# Patient Record
Sex: Male | Born: 1995 | Race: Black or African American | Hispanic: No | Marital: Single | Smoking: Former smoker
Health system: Southern US, Community
[De-identification: ages and names within clinical notes are randomized; demographics above are authoritative.]

## PROBLEM LIST (undated history)

## (undated) DIAGNOSIS — F329 Major depressive disorder, single episode, unspecified: Secondary | ICD-10-CM

## (undated) DIAGNOSIS — F419 Anxiety disorder, unspecified: Secondary | ICD-10-CM

## (undated) DIAGNOSIS — F909 Attention-deficit hyperactivity disorder, unspecified type: Secondary | ICD-10-CM

## (undated) DIAGNOSIS — J45909 Unspecified asthma, uncomplicated: Secondary | ICD-10-CM

## (undated) DIAGNOSIS — F32A Depression, unspecified: Secondary | ICD-10-CM

## (undated) DIAGNOSIS — F319 Bipolar disorder, unspecified: Secondary | ICD-10-CM

## (undated) DIAGNOSIS — G43909 Migraine, unspecified, not intractable, without status migrainosus: Secondary | ICD-10-CM

## (undated) HISTORY — DX: Anxiety disorder, unspecified: F41.9

## (undated) HISTORY — DX: Major depressive disorder, single episode, unspecified: F32.9

## (undated) HISTORY — DX: Migraine, unspecified, not intractable, without status migrainosus: G43.909

## (undated) HISTORY — PX: HERNIA REPAIR: SHX51

## (undated) HISTORY — DX: Depression, unspecified: F32.A

---
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
## 2014-02-07 ENCOUNTER — Emergency Department (HOSPITAL_COMMUNITY)
Admission: EM | Admit: 2014-02-07 | Discharge: 2014-02-07 | Disposition: A | Discharge status: Home / Self Care | Payer: Federal, State, Local not specified - PPO | Attending: Emergency Medicine | Admitting: Emergency Medicine

## 2014-02-07 ENCOUNTER — Encounter (HOSPITAL_COMMUNITY): Payer: Self-pay | Admitting: Emergency Medicine

## 2014-02-07 DIAGNOSIS — IMO0002 Reserved for concepts with insufficient information to code with codable children: Secondary | ICD-10-CM | POA: Insufficient documentation

## 2014-02-07 DIAGNOSIS — Z8659 Personal history of other mental and behavioral disorders: Secondary | ICD-10-CM | POA: Insufficient documentation

## 2014-02-07 DIAGNOSIS — J45909 Unspecified asthma, uncomplicated: Secondary | ICD-10-CM | POA: Insufficient documentation

## 2014-02-07 DIAGNOSIS — A6000 Herpesviral infection of urogenital system, unspecified: Secondary | ICD-10-CM

## 2014-02-07 DIAGNOSIS — F17200 Nicotine dependence, unspecified, uncomplicated: Secondary | ICD-10-CM | POA: Insufficient documentation

## 2014-02-07 MED ORDER — HYDROCODONE-ACETAMINOPHEN 5-325 MG PO TABS: 1.0000 | ORAL_TABLET | ORAL | Status: DC | PRN

## 2014-02-07 MED ORDER — ACYCLOVIR 400 MG PO TABS: 400.0000 mg | ORAL_TABLET | Freq: Three times a day (TID) | ORAL | Status: DC

## 2014-02-07 NOTE — ED Provider Notes (Signed)
CSN: [id number]     Arrival date & time 02/07/14  0934 History   This chart was scribed for non-physician practitioner, Mellody DrownLauren Emmi Wertheim, working with Gavin PoundMichael Y. Oletta LamasGhim, MD, by Tana ConchStephen Methvin ED Scribe. This patient was seen in TR05C/TR05C and the patient's care was started at 10:23 AM.    Chief Complaint  Patient presents with  . Rash     The history is provided by the patient. No language interpreter was used.    HPI Comments: Brian PhenesKentariq Foard-Davis is a 18 y.o. male who presents to the Emergency Department complaining of a rash on his penis that has been present for 3 days, he describes them as "puss bumps" and that they are painful to the touch.  THe patient sates they were yellow-clear fluid filled bumps, now a scab.He reports associated itching. He denies penile discharge, testicular pain, and scrotal swelling. Pt recently stopped using condoms with his girlfriend. Pt reports that he has not had similiar symptoms before. Denies history of STI.   Past Medical History  Diagnosis Date  . Depression   . Bipolar 1 disorder   . ADHD (attention deficit hyperactivity disorder)   . Anxiety   . Asthma    Past Surgical History  Procedure Laterality Date  . Hernia repair     No family history on file. History  Substance Use Topics  . Smoking status: Current Every Day Smoker  . Smokeless tobacco: Not on file  . Alcohol Use: Yes    Review of Systems  Constitutional: Negative for fever and chills.  Gastrointestinal: Negative for abdominal pain.  Genitourinary: Positive for genital sores. Negative for dysuria, discharge, penile swelling, scrotal swelling, difficulty urinating, penile pain and testicular pain.  All other systems reviewed and are negative.     Allergies  Review of patient's allergies indicates no known allergies.  Home Medications   Prior to Admission medications   Medication Sig Start Date End Date Taking? Authorizing Provider  triamcinolone (NASACORT AQ) 55 MCG/ACT  AERO nasal inhaler Place 2 sprays into the nose daily. 11/11/13   Shari A Upstill, PA-C   BP 149/89  Pulse 86  Temp(Src) 97.5 F (36.4 C) (Oral)  Resp 18  SpO2 95% Physical Exam  Nursing note and vitals reviewed. Constitutional: He is oriented to person, place, and time. He appears well-developed and well-nourished. No distress.  HENT:  Head: Normocephalic and atraumatic.  Eyes: Pupils are equal, round, and reactive to light. No scleral icterus.  Neck: Normal range of motion.  Pulmonary/Chest: Effort normal. No respiratory distress.  Abdominal: Soft.  Genitourinary: Testes normal. Right testis shows no swelling and no tenderness. Left testis shows no swelling and no tenderness. Circumcised. No discharge found.  2 painful ulcertive lesions to the right shaft of the penis. Chaperone present.    Musculoskeletal: Normal range of motion.  Lymphadenopathy:       Right: No inguinal adenopathy present.       Left: No inguinal adenopathy present.  Neurological: He is alert and oriented to person, place, and time.  Skin: Skin is warm and dry. Rash noted. He is not diaphoretic.  Psychiatric: He has a normal mood and affect. His behavior is normal.    ED Course  Procedures (including critical care time)  10:26 AM-Discussed treatment plan which includes antibiotics and a f/u with PCP with pt at bedside and pt agreed to plan.   Labs Review Labs Reviewed  GC/CHLAMYDIA PROBE AMP  RPR  HIV ANTIBODY (ROUTINE TESTING)  Imaging Review No results found.   EKG Interpretation None      MDM   Final diagnoses:  Herpes genitalis   Pt with painful ulcerative lesions to penile shaft.  STD panel ordered. Will treat for Genital Herpes, encouraged pt to discuss with current partner for treatment. And encouraged Condoms with every sexual encounter. Discussed lab results, imaging results, and treatment plan with the patient. Return precautions given. Reports understanding and no other concerns  at this time.  Patient is stable for discharge at this time.  Meds given in ED:  Medications - No data to display  New Prescriptions   ACYCLOVIR (ZOVIRAX) 400 MG TABLET    Take 1 tablet (400 mg total) by mouth 3 (three) times daily.   HYDROCODONE-ACETAMINOPHEN (NORCO/VICODIN) 5-325 MG PER TABLET    Take 1 tablet by mouth every 4 (four) hours as needed.    I personally performed the services described in this documentation, which was scribed in my presence. The recorded information has been reviewed and is accurate.    Clabe SealLauren M Santanna Olenik, PA-C 02/09/14 1314

## 2014-02-07 NOTE — ED Notes (Signed)
Pt reporting rash to penis for 3 days. Is itchy. No penile discharge. States new girlfriend and wants to make sure no STD.

## 2014-02-07 NOTE — Discharge Instructions (Signed)
You have been treated in the emergency department for an infection, possibly sexually transmitted. Results of your gonorrhea and chlamydia, HIV, Syphilis tests are pending and you will be notified if they are positive. It is very important to practice safe sex and use condoms when sexually active. If your results are positive you need to notify all sexual partners so they can be treated as well. The website https://garcia.net/http://www.dontspreadit.com/ can be used to send anonymous text messages or emails to alert sexual contacts.  Gonorrhea and Chlamydia SYMPTOMS  In females, symptoms may go unnoticed. Symptoms that are more noticeable can include:  Belly (abdominal) pain.  Painful intercourse.  Watery mucous-like discharge from the vagina.  Miscarriage.  Discomfort when urinating.  Inflammation of the rectum.  Abnormal gray-green frothy vaginal discharge  Vaginal itching and irritatio  Itching and irritation of the area outside the vagina.   Painful urination.  Bleeding after sexual intercourse.  In males, symptoms include:  Burning with urination.  Pain in the testicles.  Watery mucous-like discharge from the penis.  It can cause longstanding (chronic) pelvic pain after frequent infections.  TREATMENT  PID can cause women to not be able to have children (sterile) if left untreated or if half-treated.  It is important to finish ALL medications given to you.  This is a sexually transmitted infection. So you are also at risk for other sexually transmitted diseases, including HIV (AIDS), it is recommended that you get tested. HOME CARE INSTRUCTIONS  Warning: This infection is contagious. Do not have sex until treatment is completed. Follow up at your caregiver's office or the clinic to which you were referred. If your diagnosis (learning what is wrong) is confirmed by culture or some other method, your recent sexual contacts need treatment. Even if they are symptom free or have a negative culture or  evaluation, they should be treated.  PREVENTION  Women should use sanitary pads instead of tampons for vaginal discharge.  Wipe front to back after using the toilet and avoid douching.   Practice safe sex, use condoms, have only one sex partner and be sure your sex partner is not having sex with others.  Ask your caregiver to test you for chlamydia at your regular checkups or sooner if you are having symptoms.  Ask for further information if you are pregnant.  SEEK IMMEDIATE MEDICAL CARE IF:  You develop an oral temperature above 102 F (38.9 C), not controlled by medications or lasting more than 2 days.  You develop an increase in pain.  You develop any type of abnormal discharge.  You develop vaginal bleeding and it is not time for your period.  You develop painful intercourse.    RESOURCE GUIDE  Dental Problems  Patients with Medicaid: [facility]                     [facility] [phone]                                           [phone] Phone:  [phone]                                                  Phone:  [phone]  If unable to pay or uninsured, contact:  [facility] or [facility] to become qualified for the adult dental clinic.  Chronic Pain Problems Contact [facility]  [phone] Patients need to be referred by their primary care doctor.  Insufficient Money for Medicine Contact United Way:  call "211" or [facility] [phone].  No Primary Care Doctor Call [facility]  [phone] Other agencies that provide inexpensive medical care    [facility]  [phone]    [facility]  [phone]    [facility]  [phone]    [facility]  [phone]    [facility]  [phone]    [facility]  [phone]  Psychological Services [facility]  [phone] [facility]  [phone] [facility]   [phone]  (emergency services [phone])  Substance Abuse Resources [facility]  [phone] [facility] [phone] [facility] [phone] [facility] [phone] Residential & Outpatient Substance Abuse Program  [phone]  Abuse/Neglect [facility] Hotline [phone](336) [phone] [facility] Child [facility] Hotline (269)[phone] (After Hours)  Emergency Shelter [facility] [phone](336) [phone]  [facility] [phone] [facility] [phone]  MRSA Hotline #:   986-033-2368(754) 419-8337    [facility] Resources  [facility]     [employer]                          [facility] [address]                       [address]      [address]  [place]                                                [place]                            [place] Phone:  [phone]                                   Phone:  [phone]                 Phone:  [phone]  [facility] Phone:  [phone]  [facility] Child [facility] Hotline ([phone](336) [phone] (After Hours)

## 2014-02-08 ENCOUNTER — Telehealth: Payer: Self-pay | Admitting: General Practice

## 2014-02-08 LAB — RPR

## 2014-02-08 LAB — HIV ANTIBODY (ROUTINE TESTING W REFLEX): HIV 1&2 Ab, 4th Generation: NONREACTIVE

## 2014-02-08 NOTE — Telephone Encounter (Signed)
Patient has New to Establish appt with you on May 04, 2014 but needs a refill on Zovirax. Do you want to fill this or have the patient have it filled elsewhere?

## 2014-02-08 NOTE — Telephone Encounter (Signed)
Caller name:Fread Foard-Davis Relation to RU:EAVWUJWpt:patient Call back [phone] [id number] [place] - [address] [facility] [address]   Reason for call: to request a refill for acyclovir (ZOVIRAX) 400 MG tablet. Patient has a new patient appointment with dr Drue NovelPaz  05/04/2014.

## 2014-02-08 NOTE — Telephone Encounter (Signed)
Okay noted

## 2014-02-08 NOTE — Telephone Encounter (Signed)
He was just prescribed medications yesterday. Needs to get  established first

## 2014-02-09 LAB — GC/CHLAMYDIA PROBE AMP
CT Probe RNA: NEGATIVE
GC Probe RNA: NEGATIVE

## 2014-02-11 NOTE — ED Provider Notes (Signed)
Medical screening examination/treatment/procedure(s) were performed by non-physician practitioner and as supervising physician I was immediately available for consultation/collaboration.  Niles Ess Y. Quindarius Cabello, MD 02/11/14 2300 

## 2014-02-17 ENCOUNTER — Telehealth: Payer: Self-pay

## 2014-02-17 NOTE — Telephone Encounter (Signed)
Patient called for refill on valtrex. He had called earlier but he never received a call back. Advised scheduler that he would need an acute visit (med refill)  prior to his New Patient appt.  Patient scheduled.

## 2014-02-19 ENCOUNTER — Ambulatory Visit (INDEPENDENT_AMBULATORY_CARE_PROVIDER_SITE_OTHER): Payer: Federal, State, Local not specified - PPO | Admitting: Internal Medicine

## 2014-02-19 ENCOUNTER — Encounter: Payer: Self-pay | Admitting: Internal Medicine

## 2014-02-19 VITALS — BP 121/80 | HR 75 | Temp 98.2°F | Wt 268.0 lb

## 2014-02-19 DIAGNOSIS — F319 Bipolar disorder, unspecified: Secondary | ICD-10-CM | POA: Insufficient documentation

## 2014-02-19 DIAGNOSIS — A6000 Herpesviral infection of urogenital system, unspecified: Secondary | ICD-10-CM

## 2014-02-19 NOTE — Patient Instructions (Signed)
Safe Sex Safe sex is about reducing the risk of giving or getting a sexually transmitted disease (STD). STDs are spread through sexual contact involving the genitals, mouth, or rectum. Some STDS can be cured and others cannot. Safe sex can also prevent unintended pregnancies.  SAFE SEX PRACTICES  Limit your sexual activity to only one partner who is only having sex with you.  Talk to your partner about their past partners, past STDs, and drug use.  Use a condom every time you have sexual intercourse. This includes vaginal, oral, and anal sexual activity. Both females and males should wear condoms during oral sex. Only use latex or polyurethane condoms and water-based lubricants. Petroleum-based lubricants or oils used to lubricate a condom will weaken the condom and increase the chance that it will break. The condom should be in place from the beginning to the end of sexual activity. Wearing a condom reduces, but does not completely eliminate, your risk of getting or giving a STD. STDs can be spread by contact with skin of surrounding areas.  Get vaccinated for hepatitis B and HPV.  Avoid alcohol and recreational drugs which can affect your judgement. You may forget to use a condom or participate in high-risk sex.  For females, avoid douching after sexual intercourse. Douching can spread an infection farther into the reproductive tract.  Check your body for signs of sores, blisters, rashes, or unusual discharge. See your caregiver if you notice any of these signs.  Avoid sexual contact if you have symptoms of an infection or are being treated for an STD. If you or your partner has herpes, avoid sexual contact when blisters are present. Use condoms at all other times.  See your caregiver for regular screenings, examinations, and tests for STDs. Before having sex with a new partner, each of you should be screened for STDs and talk about the results with your partner. BENEFITS OF SAFE SEX    There is less of a chance of getting or giving an STD.  You can prevent unwanted or unintended pregnancies.  By discussing safer sex concerns with your partner, you may increase feelings of intimacy, comfort, trust, and honesty between the both of you. Document Released: 11/01/2004 Document Revised: 06/18/2012 Document Reviewed: 03/17/2012 [facility] Patient Information 2014 [place], [place].    Genital Herpes Genital herpes is a sexually transmitted disease. This means that it is a disease passed by having sex with an infected person. There is no cure for genital herpes. The time between attacks can be months to years. The virus may live in a person but produce no problems (symptoms). This infection can be passed to a baby as it travels down the birth canal (vagina). In a newborn, this can cause central nervous system damage, eye damage, or even death. The virus that causes genital herpes is usually HSV-2 virus. The virus that causes oral herpes is usually HSV-1. The diagnosis (learning what is wrong) is made through culture results. SYMPTOMS  Usually symptoms of pain and itching begin a few days to a week after contact. It first appears as small blisters that progress to small painful ulcers which then scab over and heal after several days. It affects the outer genitalia, birth canal, cervix, penis, anal area, buttocks, and thighs. HOME CARE INSTRUCTIONS   Keep ulcerated areas dry and clean.  Take medications as directed. Antiviral medications can speed up healing. They will not prevent recurrences or cure this infection. These medications can also be taken for suppression if  there are frequent recurrences.  While the infection is active, it is contagious. Avoid all sexual contact during active infections.  Condoms may help prevent spread of the herpes virus.  Practice safe sex.  Wash your hands thoroughly after touching the genital area.  Avoid touching your eyes after touching your  genital area.  Inform your caregiver if you have had genital herpes and become pregnant. It is your responsibility to insure a safe outcome for your baby in this pregnancy.  Only take over-the-counter or prescription medicines for pain, discomfort, or fever as directed by your caregiver. SEEK MEDICAL CARE IF:   You have a recurrence of this infection.  You do not respond to medications and are not improving.  You have new sources of pain or discharge which have changed from the original infection.  You have an oral temperature above 102 F (38.9 C).  You develop abdominal pain.  You develop eye pain or signs of eye infection. Document Released: 09/21/2000 Document Revised: 12/17/2011 Document Reviewed: 10/12/2009 [facility] Patient Information 2014 [place], [place].

## 2014-02-19 NOTE — Assessment & Plan Note (Addendum)
Recently seen in  At the ER with findings consistent with genital herpes, rashes is gone,  chart review, all labs were negative. Discussed  the  Diagnoses,  we also discussed safe sex. No need to renew thew  Rx at this time, if rash resurface , will need a rx prn, if rash is recurrent will need rx qd Will discuss more at time of CPX in few weeks

## 2014-02-19 NOTE — Progress Notes (Signed)
   Subjective:    Patient ID: [name], male    DOB: 02/19/1996, 18 y.o.   MRN: [id number]  DOS:  02/19/2014 Type of  visit: Acute visit, has a CPX schedule in few weeks Went to the ER a few days ago with symptoms and signs of genital herpes, was treated with Zovirax. Requests a refill. Rash is gone, has leave very small scar.   ROS Currently denies dysuria, gross hematuria or any rash.  Past Medical History  Diagnosis Date  . Anxiety and depression   . Bipolar 1 disorder   . ADHD (attention deficit hyperactivity disorder)   . Asthma     Past Surgical History  Procedure Laterality Date  . Hernia repair      umbilical    History   Social History  . Marital Status: Single    Spouse Name: N/A    Number of Children: 0  . Years of Education: N/A   Occupational History  . 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 Smoking status: Never Smoker   . Smokeless tobacco: Never Used  . Alcohol Use: Yes     Comment: socially   . Drug Use: No  . Sexual Activity: Not on file   Other Topics Concern  . Not on file   [facility] History Narrative   [facility]     Family History  Problem Relation Age of Onset  . Diabetes      gparents  . Cancer Neg Hx        Medication List       This list is accurate as of: 02/19/14 11:59 PM.  Always use your most recent med list.               acyclovir 400 MG tablet  Commonly known as:  ZOVIRAX  Take 1 tablet (400 mg total) by mouth 3 (three) times daily.           Objective:   Physical Exam BP 121/80  Pulse 75  Temp(Src) 98.2 F (36.8 C)  Wt 268 lb (121.564 kg)  SpO2 97%  General -- alert, well-developed, NAD.  GU-- No inguinal LADs Penis with a 2 mm dry lesion at the shaft. No discharge Scrotal contents normal Neurologic--  alert & oriented X3. Speech normal, gait normal, strength normal in all extremities.  Psych-- Cognition and judgment appear intact. Cooperative with  normal attention span and concentration. No anxious or depressed appearing.     Assessment & Plan:    Today , I spent more than 20  min with the patient, >50% of the time counseling, and   reviewing the chart and labs ordered by other providers

## 2014-02-19 NOTE — Assessment & Plan Note (Signed)
Reports  at the present time is doing well emotionally without any medications

## 2014-05-04 ENCOUNTER — Ambulatory Visit: Payer: Federal, State, Local not specified - PPO | Admitting: Internal Medicine

## 2014-05-11 ENCOUNTER — Ambulatory Visit (INDEPENDENT_AMBULATORY_CARE_PROVIDER_SITE_OTHER): Payer: Federal, State, Local not specified - PPO | Admitting: Internal Medicine

## 2014-05-11 ENCOUNTER — Encounter: Payer: Self-pay | Admitting: Internal Medicine

## 2014-05-11 VITALS — BP 131/84 | HR 78 | Temp 98.2°F | Ht 71.5 in | Wt 270.0 lb

## 2014-05-11 DIAGNOSIS — A6000 Herpesviral infection of urogenital system, unspecified: Secondary | ICD-10-CM | POA: Diagnosis not present

## 2014-05-11 MED ORDER — VALACYCLOVIR HCL 500 MG PO TABS: 500.0000 mg | ORAL_TABLET | Freq: Two times a day (BID) | ORAL | Status: DC

## 2014-05-11 NOTE — Progress Notes (Signed)
   Subjective:    Patient ID: [name], male    DOB: 03/27/1996, 18 y.o.   MRN: [id number]  DOS:  05/11/2014 Type of visit - description: CPX History: Here for physical but declined a CPX, just like a prescription for genital herpes. Since the last time he was here, he had one outbreak described as blisters at the penis which self resolved       Past Medical History  Diagnosis Date  . Anxiety and depression   . Bipolar 1 disorder   . ADHD (attention deficit hyperactivity disorder)   . Asthma     Past Surgical History  Procedure Laterality Date  . Hernia repair      umbilical    History   Social History  . Marital Status: Single    Spouse Name: N/A    Number of Children: 0  . Years of Education: N/A   Occupational History  . 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 Smoking status: Light Tobacco Smoker  . Smokeless tobacco: Never Used  . Alcohol Use: Yes     Comment: socially   . Drug Use: No  . Sexual Activity: Not on file   Other Topics Concern  . Not on file   Social History Narrative   [facility]        Medication List       This list is accurate as of: 05/11/14  7:17 PM.  Always use your most recent med list.               valACYclovir 500 MG tablet  Commonly known as:  VALTREX  Take 1 tablet (500 mg total) by mouth 2 (two) times daily. For 3 days for each herpes flare up           Objective:   Physical Exam BP 131/84  Pulse 78  Temp(Src) 98.2 F (36.8 C)  Ht 5' 11.5" (1.816 m)  Wt 270 lb (122.471 kg)  BMI 37.14 kg/m2  SpO2 94% General -- alert, well-developed, NAD.  GU- No active lesions of the penis, scrotal content normal, no penile discharge Neurologic--  alert & oriented X3. Speech normal, gait appropriate for age, strength symmetric and appropriate for age.  Psych-- Cognition and judgment appear intact. Cooperative with normal attention span and concentration. No anxious or depressed  appearing.        Assessment & Plan:

## 2014-05-11 NOTE — Assessment & Plan Note (Signed)
Had one flareup in the last 3 months, at this point I recommend Valtrex as needed, how to use this medication discussed with the patient, prescription provided. Return to the office in 6 months we will check a HIV and RPR again

## 2014-05-11 NOTE — Patient Instructions (Addendum)
Next visit is for routine check up in 6 months    Genital Herpes Genital herpes is a sexually transmitted disease. This means that it is a disease passed by having sex with an infected person. There is no cure for genital herpes. The time between attacks can be months to years. The virus may live in a person but produce no problems (symptoms). This infection can be passed to a baby as it travels down the birth canal (vagina). In a newborn, this can cause central nervous system damage, eye damage, or even death. The virus that causes genital herpes is usually HSV-2 virus. The virus that causes oral herpes is usually HSV-1. The diagnosis (learning what is wrong) is made through culture results. SYMPTOMS  Usually symptoms of pain and itching begin a few days to a week after contact. It first appears as small blisters that progress to small painful ulcers which then scab over and heal after several days. It affects the outer genitalia, birth canal, cervix, penis, anal area, buttocks, and thighs. HOME CARE INSTRUCTIONS   Keep ulcerated areas dry and clean.  Take medications as directed. Antiviral medications can speed up healing. They will not prevent recurrences or cure this infection. These medications can also be taken for suppression if there are frequent recurrences.  While the infection is active, it is contagious. Avoid all sexual contact during active infections.  Condoms may help prevent spread of the herpes virus.  Practice safe sex.  Wash your hands thoroughly after touching the genital area.  Avoid touching your eyes after touching your genital area.  Inform your caregiver if you have had genital herpes and become pregnant. It is your responsibility to insure a safe outcome for your baby in this pregnancy.  Only take over-the-counter or prescription medicines for pain, discomfort, or fever as directed by your caregiver. SEEK MEDICAL CARE IF:   You have a recurrence of this  infection.  You do not respond to medications and are not improving.  You have new sources of pain or discharge which have changed from the original infection.  You have an oral temperature above 102 F (38.9 C).  You develop abdominal pain.  You develop eye pain or signs of eye infection. Document Released: 09/21/2000 Document Revised: 12/17/2011 Document Reviewed: 10/12/2009 [facility] Patient Information 2015 [place]. This information is not intended to replace advice given to you by your health care provider. Make sure you discuss any questions you have with your health care provider.

## 2014-06-30 ENCOUNTER — Emergency Department (HOSPITAL_COMMUNITY): Payer: Federal, State, Local not specified - PPO

## 2014-06-30 ENCOUNTER — Encounter (HOSPITAL_COMMUNITY): Payer: Self-pay | Admitting: Emergency Medicine

## 2014-06-30 ENCOUNTER — Emergency Department (HOSPITAL_COMMUNITY)
Admission: EM | Admit: 2014-06-30 | Discharge: 2014-06-30 | Disposition: A | Discharge status: Home / Self Care | Payer: Federal, State, Local not specified - PPO | Attending: Emergency Medicine | Admitting: Emergency Medicine

## 2014-06-30 DIAGNOSIS — Y92410 Unspecified street and highway as the place of occurrence of the external cause: Secondary | ICD-10-CM | POA: Insufficient documentation

## 2014-06-30 DIAGNOSIS — Z8659 Personal history of other mental and behavioral disorders: Secondary | ICD-10-CM | POA: Diagnosis not present

## 2014-06-30 DIAGNOSIS — S46909A Unspecified injury of unspecified muscle, fascia and tendon at shoulder and upper arm level, unspecified arm, initial encounter: Secondary | ICD-10-CM | POA: Diagnosis not present

## 2014-06-30 DIAGNOSIS — Z79899 Other long term (current) drug therapy: Secondary | ICD-10-CM | POA: Insufficient documentation

## 2014-06-30 DIAGNOSIS — J45909 Unspecified asthma, uncomplicated: Secondary | ICD-10-CM | POA: Insufficient documentation

## 2014-06-30 DIAGNOSIS — S4980XA Other specified injuries of shoulder and upper arm, unspecified arm, initial encounter: Secondary | ICD-10-CM | POA: Insufficient documentation

## 2014-06-30 DIAGNOSIS — S0990XA Unspecified injury of head, initial encounter: Secondary | ICD-10-CM | POA: Diagnosis not present

## 2014-06-30 DIAGNOSIS — IMO0002 Reserved for concepts with insufficient information to code with codable children: Secondary | ICD-10-CM | POA: Insufficient documentation

## 2014-06-30 DIAGNOSIS — S0993XA Unspecified injury of face, initial encounter: Secondary | ICD-10-CM | POA: Diagnosis not present

## 2014-06-30 DIAGNOSIS — Y9389 Activity, other specified: Secondary | ICD-10-CM | POA: Insufficient documentation

## 2014-06-30 DIAGNOSIS — S298XXA Other specified injuries of thorax, initial encounter: Secondary | ICD-10-CM | POA: Diagnosis not present

## 2014-06-30 DIAGNOSIS — F17200 Nicotine dependence, unspecified, uncomplicated: Secondary | ICD-10-CM | POA: Diagnosis not present

## 2014-06-30 DIAGNOSIS — S199XXA Unspecified injury of neck, initial encounter: Secondary | ICD-10-CM

## 2014-06-30 LAB — I-STAT CREATININE, ED: Creatinine, Ser: 1.1 mg/dL (ref 0.50–1.35)

## 2014-06-30 MED ORDER — ONDANSETRON HCL 4 MG/2ML IJ SOLN
4.0000 mg | Freq: Once | INTRAMUSCULAR | Status: AC
  Administered 2014-06-30: 4 mg via INTRAVENOUS
  Filled 2014-06-30: qty 2

## 2014-06-30 MED ORDER — IOHEXOL 300 MG/ML  SOLN
100.0000 mL | Freq: Once | INTRAMUSCULAR | Status: AC | PRN
  Administered 2014-06-30: 100 mL via INTRAVENOUS

## 2014-06-30 MED ORDER — MORPHINE SULFATE 4 MG/ML IJ SOLN
4.0000 mg | Freq: Once | INTRAMUSCULAR | Status: AC
  Administered 2014-06-30: 4 mg via INTRAVENOUS
  Filled 2014-06-30: qty 1

## 2014-06-30 MED ORDER — OXYCODONE-ACETAMINOPHEN 5-325 MG PO TABS: 1.0000 | ORAL_TABLET | ORAL | Status: DC | PRN

## 2014-06-30 MED ORDER — METHOCARBAMOL 500 MG PO TABS
500.0000 mg | ORAL_TABLET | Freq: Once | ORAL | Status: AC
  Administered 2014-06-30: 500 mg via ORAL
  Filled 2014-06-30: qty 1
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
## 2014-06-30 NOTE — ED Provider Notes (Signed)
Medical screening examination/treatment/procedure(s) were performed by non-physician practitioner and as supervising physician I was immediately available for consultation/collaboration.   EKG Interpretation None        Supriya Beaston, MD 06/30/14 2253 

## 2014-06-30 NOTE — ED Notes (Signed)
Pt on CT at this time.

## 2014-07-01 ENCOUNTER — Encounter: Payer: Self-pay | Admitting: Internal Medicine

## 2014-07-01 ENCOUNTER — Ambulatory Visit (INDEPENDENT_AMBULATORY_CARE_PROVIDER_SITE_OTHER): Payer: Federal, State, Local not specified - PPO | Admitting: Internal Medicine

## 2014-07-01 VITALS — BP 128/62 | HR 79 | Temp 98.8°F | Wt 273.2 lb

## 2014-07-01 DIAGNOSIS — R519 Headache, unspecified: Secondary | ICD-10-CM

## 2014-07-01 DIAGNOSIS — R51 Headache: Secondary | ICD-10-CM

## 2014-07-01 DIAGNOSIS — M546 Pain in thoracic spine: Secondary | ICD-10-CM

## 2014-07-01 DIAGNOSIS — M542 Cervicalgia: Secondary | ICD-10-CM

## 2014-07-01 MED ORDER — CYCLOBENZAPRINE HCL 10 MG PO TABS: 10.0000 mg | ORAL_TABLET | Freq: Two times a day (BID) | ORAL | Status: DC | PRN

## 2014-07-01 MED ORDER — KETOROLAC TROMETHAMINE 10 MG PO TABS: 10.0000 mg | ORAL_TABLET | Freq: Four times a day (QID) | ORAL | Status: DC | PRN

## 2014-07-01 NOTE — Patient Instructions (Signed)
Take Toradol and Flexeril as needed Continuing OxyContin Go to the ER if you are not gradually improving in the next 48 hours or if   severe headache, nausea, vomiting, double vision or neck stiffness. Come back in 10 days to see me

## 2014-07-01 NOTE — Progress Notes (Signed)
Pre visit review using our clinic review tool, if applicable. No additional management support is needed unless otherwise documented below in the visit note. 
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
## 2014-07-02 ENCOUNTER — Telehealth: Payer: Self-pay

## 2014-07-02 NOTE — Telephone Encounter (Signed)
Not for now, come back in 10 days and we'll see how he is doing, should gradually improve

## 2014-07-02 NOTE — Telephone Encounter (Signed)
Spoke with Pt, he stated that his headaches are gone and he can tell most of the swelling in his neck has gone down, but is still experiencing back pain. He didn't know if he could try something else for the pain.

## 2014-07-02 NOTE — Telephone Encounter (Signed)
LMOM for Pt to return call. Called Pt to check on his headaches, neck pain and back pain.

## 2014-07-08 ENCOUNTER — Telehealth: Payer: Self-pay | Admitting: Internal Medicine

## 2014-07-08 MED ORDER — OXYCODONE-ACETAMINOPHEN 5-325 MG PO TABS: 1.0000 | ORAL_TABLET | ORAL | Status: DC | PRN

## 2014-07-08 NOTE — Telephone Encounter (Signed)
done

## 2014-07-08 NOTE — Telephone Encounter (Signed)
Caller name: Resa MinerKentariq Relation to pt: self Call back number: [phone] Pharmacy:  Reason for call:   Patient is requesting a new oxy rx

## 2014-07-08 NOTE — Telephone Encounter (Signed)
Pt is requesting refill for oxycodone. Rx was given 06/30/2014 # 15 with 0 RF. Pt has F/U appt on 10/5.   Please advise.

## 2014-07-08 NOTE — Telephone Encounter (Signed)
Informed Pt that Oxycodone rx is at front desk for pick up. Pt stated he is currently out of town until tomorrow but will come by tomorrow to get it.

## 2014-07-12 ENCOUNTER — Ambulatory Visit (INDEPENDENT_AMBULATORY_CARE_PROVIDER_SITE_OTHER): Payer: Federal, State, Local not specified - PPO | Admitting: Internal Medicine

## 2014-07-12 ENCOUNTER — Telehealth: Payer: Self-pay | Admitting: Internal Medicine

## 2014-07-12 ENCOUNTER — Encounter: Payer: Self-pay | Admitting: Internal Medicine

## 2014-07-12 DIAGNOSIS — S139XXD Sprain of joints and ligaments of unspecified parts of neck, subsequent encounter: Secondary | ICD-10-CM

## 2014-07-12 NOTE — Telephone Encounter (Signed)
Caller name: Resa MinerKentariq Relation to pt: Call back number:[phone]   Reason for call:  Pt is needing the letter that was typed today for his return to work to say something else, please call back.;

## 2014-07-12 NOTE — Progress Notes (Signed)
Pre visit review using our clinic review tool, if applicable. No additional management support is needed unless otherwise documented below in the visit note. 

## 2014-07-12 NOTE — Patient Instructions (Addendum)
Take Tylenol or Motrin OTC as needed for pain If the pain is severe, take OxyContin Take  Flexeril twice a day as needed for neck stiffness Warm compress twice a day  Followup one month

## 2014-07-12 NOTE — Assessment & Plan Note (Addendum)
See previous office visit note, overall he is improving. BP upon patient arrival was elevated, it was recheck in it was 130/78. At this point has no pain but neck stiffness. Plan: Physical therapy Tylenol-motrin , warm compresses, oxycodone only if severe pain. Will be released to work

## 2014-07-12 NOTE — Telephone Encounter (Signed)
Pt needs this asap for his job.

## 2014-07-12 NOTE — Progress Notes (Signed)
   Subjective:    Patient ID: [name], male    DOB: 05/07/1996, 18 y.o.   MRN: [id number]  DOS:  07/12/2014 Type of visit - description : Followup from previous visit Interval history: Since the last visit, the headache and neck pain are gone, he has been left with some stiffness at the neck posteriorly, worse on the left. In the last 2 days has not taken any Motrin, Aleve, Flexeril, Toradol or oxycodone because there is not pain just stiffness  ROS Denies nausea vomiting, no upper extremity paresthesias No gait  difficulties No diplopia.   Past Medical History  Diagnosis Date  . Anxiety and depression   . Bipolar 1 disorder   . ADHD (attention deficit hyperactivity disorder)   . Asthma     Past Surgical History  Procedure Laterality Date  . Hernia repair      umbilical    History   Social History  . Marital Status: Single    Spouse Name: N/A    Number of Children: 0  . Years of Education: N/A   Occupational History  . 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 Smoking status: Light Tobacco Smoker  . Smokeless tobacco: Never Used  . Alcohol Use: Yes     Comment: socially   . Drug Use: No  . Sexual Activity: Not on file   Other Topics Concern  . Not on file   Social History Narrative   [facility]        Medication List       This list is accurate as of: 07/12/14  5:57 PM.  Always use your most recent med list.               cyclobenzaprine 10 MG tablet  Commonly known as:  FLEXERIL  Take 1 tablet (10 mg total) by mouth 2 (two) times daily as needed for muscle spasms.     oxyCODONE-acetaminophen 5-325 MG per tablet  Commonly known as:  PERCOCET/ROXICET  Take 1 tablet by mouth every 4 (four) hours as needed for severe pain. May take 2 tablets PO q 6 hours for severe pain - Do not take with Tylenol as this tablet already contains tylenol     valACYclovir 500 MG tablet  Commonly known as:  VALTREX  Take 1  tablet (500 mg total) by mouth 2 (two) times daily. For 3 days for each herpes flare up           Objective:   Physical Exam BP 132/78  Pulse 102  Temp(Src) 98.8 F (37.1 C) (Oral)  Wt 279 lb 2 oz (126.61 kg)  SpO2 97% General -- alert, well-developed, NAD.  Neck --FROM, no TTP HEENT-- Not pale.   Extremities-- no pretibial edema bilaterally  Neurologic--  alert & oriented X3. Speech normal, gait appropriate for age, strength symmetric and appropriate for age.  DTRs symmetric. Psych-- Cognition and judgment appear intact. Cooperative with normal attention span and concentration. No anxious or depressed appearing.     Assessment & Plan:

## 2014-07-13 ENCOUNTER — Telehealth: Payer: Self-pay | Admitting: Internal Medicine

## 2014-07-13 NOTE — Telephone Encounter (Signed)
Spoke with Pt, informed he needed letter sent to him with "released back to work with no restrictions", letter sent to Pt through MyChart.

## 2014-07-13 NOTE — Telephone Encounter (Signed)
emmi emailed °

## 2014-07-23 ENCOUNTER — Telehealth: Payer: Self-pay | Admitting: *Deleted

## 2014-07-23 ENCOUNTER — Other Ambulatory Visit: Payer: Self-pay

## 2014-07-23 NOTE — Telephone Encounter (Signed)
Patient dropped off FMLA paperwork at our front desk.

## 2014-07-26 ENCOUNTER — Ambulatory Visit: Payer: Federal, State, Local not specified - PPO | Attending: Internal Medicine | Admitting: Physical Therapy

## 2014-07-26 ENCOUNTER — Telehealth: Payer: Self-pay | Admitting: *Deleted

## 2014-07-26 DIAGNOSIS — R293 Abnormal posture: Secondary | ICD-10-CM | POA: Insufficient documentation

## 2014-07-26 DIAGNOSIS — M542 Cervicalgia: Secondary | ICD-10-CM | POA: Diagnosis not present

## 2014-07-26 DIAGNOSIS — Z5189 Encounter for other specified aftercare: Secondary | ICD-10-CM | POA: Insufficient documentation

## 2014-07-26 DIAGNOSIS — J45909 Unspecified asthma, uncomplicated: Secondary | ICD-10-CM | POA: Diagnosis not present

## 2014-07-26 MED ORDER — CYCLOBENZAPRINE HCL 10 MG PO TABS: 10.0000 mg | ORAL_TABLET | Freq: Two times a day (BID) | ORAL | Status: DC | PRN

## 2014-07-26 NOTE — Telephone Encounter (Signed)
Pt is requesting refill on cyclobenazeprine and oxycodone.  Last OV: 07/12/2014  Last Fill on cyclobenazeprine 07/01/2014 # 30 0 RF Last Fill on oxycodone 07/08/2014 #15 0 RF  UDS: None  Please advise.

## 2014-07-26 NOTE — Telephone Encounter (Signed)
Pt request refill on the following medications  cyclobenzaprine (FLEXERIL) 10 MG tablet  Last written 07/01/2014, #30, no refills Last OV 07/12/2014  oxyCODONE-acetaminophen (PERCOCET/ROXICET) 5-325 MG per tablet Last written 07/08/2014, #15, no refills  Please advise.  bw

## 2014-07-26 NOTE — Telephone Encounter (Signed)
Please RF  cyclobenzaprine #30, 1 RF Recommend to discontinue oxycodone, to take Tylenol or Motrin as needed for pain.

## 2014-07-26 NOTE — Telephone Encounter (Signed)
[facility]obenazeprine refilled to [facility].

## 2014-07-28 DIAGNOSIS — Z7689 Persons encountering health services in other specified circumstances: Secondary | ICD-10-CM

## 2014-07-28 NOTE — Telephone Encounter (Signed)
Completed/signed forms faxed to [facility] and a copy mailed to patient's home address. Originals sent for scanning. JG//CMA

## 2014-07-30 ENCOUNTER — Ambulatory Visit: Payer: Federal, State, Local not specified - PPO | Admitting: Physical Therapy

## 2014-07-30 DIAGNOSIS — Z5189 Encounter for other specified aftercare: Secondary | ICD-10-CM | POA: Diagnosis not present

## 2014-08-03 ENCOUNTER — Ambulatory Visit: Payer: Federal, State, Local not specified - PPO | Admitting: Physical Therapy

## 2014-08-06 ENCOUNTER — Ambulatory Visit: Payer: Federal, State, Local not specified - PPO | Admitting: Physical Therapy

## 2014-08-06 DIAGNOSIS — Z5189 Encounter for other specified aftercare: Secondary | ICD-10-CM | POA: Diagnosis not present

## 2014-08-10 ENCOUNTER — Ambulatory Visit: Payer: Federal, State, Local not specified - PPO | Attending: Internal Medicine | Admitting: Physical Therapy

## 2014-08-10 DIAGNOSIS — Z5189 Encounter for other specified aftercare: Secondary | ICD-10-CM | POA: Diagnosis not present

## 2014-08-10 DIAGNOSIS — M542 Cervicalgia: Secondary | ICD-10-CM | POA: Insufficient documentation

## 2014-08-10 DIAGNOSIS — R293 Abnormal posture: Secondary | ICD-10-CM | POA: Diagnosis not present

## 2014-08-10 DIAGNOSIS — J45909 Unspecified asthma, uncomplicated: Secondary | ICD-10-CM | POA: Diagnosis not present

## 2014-08-12 ENCOUNTER — Ambulatory Visit: Payer: Federal, State, Local not specified - PPO | Admitting: Internal Medicine

## 2014-08-13 ENCOUNTER — Ambulatory Visit: Payer: Federal, State, Local not specified - PPO | Admitting: Physical Therapy

## 2014-08-25 ENCOUNTER — Ambulatory Visit (INDEPENDENT_AMBULATORY_CARE_PROVIDER_SITE_OTHER): Payer: Federal, State, Local not specified - PPO | Admitting: Internal Medicine

## 2014-08-25 ENCOUNTER — Encounter: Payer: Self-pay | Admitting: Internal Medicine

## 2014-08-25 ENCOUNTER — Ambulatory Visit: Payer: Federal, State, Local not specified - PPO | Admitting: Internal Medicine

## 2014-08-25 VITALS — BP 159/92 | HR 90 | Temp 98.5°F | Wt 275.5 lb

## 2014-08-25 DIAGNOSIS — Z202 Contact with and (suspected) exposure to infections with a predominantly sexual mode of transmission: Secondary | ICD-10-CM

## 2014-08-25 DIAGNOSIS — A6000 Herpesviral infection of urogenital system, unspecified: Secondary | ICD-10-CM

## 2014-08-25 MED ORDER — VALACYCLOVIR HCL 500 MG PO TABS: 500.0000 mg | ORAL_TABLET | Freq: Two times a day (BID) | ORAL | Status: DC

## 2014-08-25 MED ORDER — METRONIDAZOLE 500 MG PO TABS: 1000.0000 mg | ORAL_TABLET | Freq: Two times a day (BID) | ORAL | Status: DC

## 2014-08-25 NOTE — Progress Notes (Signed)
   Subjective:    Patient ID: [name], male    DOB: 10/08/1996, 18 y.o.   MRN: [id number]  DOS:  08/25/2014 Type of visit - description : acute Interval history: Girlfriend was diagnosed with trich and did not finish treatment, they have unprotected sex, reports mild dysuria for the last 2 days.  Request a refill on Oxcodone, still has mild neck pain.   ROS Denies any penile discharge. Admits to occasional rash on the penis, on and off, described as "dryness of the skin"   Past Medical History  Diagnosis Date  . Anxiety and depression   . Bipolar 1 disorder   . ADHD (attention deficit hyperactivity disorder)   . Asthma     Past Surgical History  Procedure Laterality Date  . Hernia repair      umbilical    History   Social History  . Marital Status: Single    Spouse Name: N/A    Number of Children: 0  . Years of Education: N/A   Occupational History  . 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 Smoking status: Light Tobacco Smoker  . Smokeless tobacco: Never Used  . Alcohol Use: Yes     Comment: socially   . Drug Use: No  . Sexual Activity: Not on file   Other Topics Concern  . Not on file   Social History Narrative   [facility]        Medication List       This list is accurate as of: 08/25/14  6:14 PM.  Always use your most recent med list.               cyclobenzaprine 10 MG tablet  Commonly known as:  FLEXERIL  Take 1 tablet (10 mg total) by mouth 2 (two) times daily as needed for muscle spasms.     metroNIDAZOLE 500 MG tablet  Commonly known as:  FLAGYL  Take 2 tablets (1,000 mg total) by mouth 2 (two) times daily.     valACYclovir 500 MG tablet  Commonly known as:  VALTREX  Take 1 tablet (500 mg total) by mouth 2 (two) times daily. For 3 days for each herpes flare up           Objective:   Physical Exam BP 159/92 mmHg  Pulse 90  Temp(Src) 98.5 F (36.9 C) (Oral)  Wt 275 lb 8 oz  (124.966 kg)  SpO2 100% General -- alert, well-developed, NAD.  GU-- Scrotal contents normal Penis without discharge, he does have some eczematous changes in the skin (dryness, scaliness) @ the shaft; no inguinal lymphadenopathies Extremities-- no pretibial edema bilaterally  Neurologic--  alert & oriented X3. Speech normal, gait appropriate for age, strength symmetric and appropriate for age.   Psych-- Cognition and judgment appear intact. Cooperative with normal attention span and concentration. No anxious or depressed appearing.      Assessment & Plan:   STD exposure Exposed to Trichomonas, recommend Flagyl 1 g twice a day for 2 days Safe sex Check a G&C Has a rash on the penis, likely eczema, OTC hydrocortisone   Genital herpes, rf valtrex

## 2014-08-25 NOTE — Patient Instructions (Signed)
Go to the lab  before you leave , need a  urine sample.  Take metronidazole 2 tablets twice a day for 2 days  Use hydrocortisone 1% OTC cream as needed for the rash  For pain, take Flexeril and Tylenol. In the neck pain persists call for a referral   Safe Sex Safe sex is about reducing the risk of giving or getting a sexually transmitted disease (STD). STDs are spread through sexual contact involving the genitals, mouth, or rectum. Some STDs can be cured and others cannot. Safe sex can also prevent unintended pregnancies.  WHAT ARE SOME SAFE SEX PRACTICES?  Limit your sexual activity to only one partner who is having sex with only you.  Talk to your partner about his or her past partners, past STDs, and drug use.  Use a condom every time you have sexual intercourse. This includes vaginal, oral, and anal sexual activity. Both females and males should wear condoms during oral sex. Only use latex or polyurethane condoms and water-based lubricants. Using petroleum-based lubricants or oils to lubricate a condom will weaken the condom and increase the chance that it will break. The condom should be in place from the beginning to the end of sexual activity. Wearing a condom reduces, but does not completely eliminate, your risk of getting or giving an STD. STDs can be spread by contact with infected body fluids and skin.  Get vaccinated for hepatitis B and HPV.  Avoid alcohol and recreational drugs, which can affect your judgment. You may forget to use a condom or participate in high-risk sex.  For females, avoid douching after sexual intercourse. Douching can spread an infection farther into the reproductive tract.  Check your body for signs of sores, blisters, rashes, or unusual discharge. See your health care provider if you notice any of these signs.  Avoid sexual contact if you have symptoms of an infection or are being treated for an STD. If you or your partner has herpes, avoid sexual  contact when blisters are present. Use condoms at all other times.  If you are at risk of being infected with HIV, it is recommended that you take a prescription medicine daily to prevent HIV infection. This is called pre-exposure prophylaxis (PrEP). You are considered at risk if:  You are a man who has sex with other men (MSM).  You are a heterosexual man or woman who is sexually active with more than one partner.  You take drugs by injection.  You are sexually active with a partner who has HIV.  Talk with your health care provider about whether you are at high risk of being infected with HIV. If you choose to begin PrEP, you should first be tested for HIV. You should then be tested every 3 months for as long as you are taking PrEP.  See your health care provider for regular screenings, exams, and tests for other STDs. Before having sex with a new partner, each of you should be screened for STDs and should talk about the results with each other. WHAT ARE THE BENEFITS OF SAFE SEX?   There is less chance of getting or giving an STD.  You can prevent unwanted or unintended pregnancies.  By discussing safe sex concerns with your partner, you may increase feelings of intimacy, comfort, trust, and honesty between the two of you. Document Released: 11/01/2004 Document Revised: 02/08/2014 Document Reviewed: 03/17/2012 [facility] Patient Information 2015 [place], [place]. This information is not intended to replace advice given to you  by your health care provider. Make sure you discuss any questions you have with your health care provider.

## 2014-08-25 NOTE — Progress Notes (Signed)
Pre visit review using our clinic review tool, if applicable. No additional management support is needed unless otherwise documented below in the visit note. 
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
## 2014-08-26 LAB — GC/CHLAMYDIA PROBE AMP, URINE
Chlamydia, Swab/Urine, PCR: NEGATIVE
GC Probe Amp, Urine: NEGATIVE

## 2014-08-27 ENCOUNTER — Telehealth: Payer: Self-pay | Admitting: Internal Medicine

## 2014-08-27 NOTE — Telephone Encounter (Signed)
Caller name:  Resa MinerKentariq Relation to pt: self Call back number: [phone] Pharmacy:  Reason for call:   Patient requesting last labs

## 2014-08-27 NOTE — Telephone Encounter (Signed)
Please advise 

## 2014-08-27 NOTE — Telephone Encounter (Signed)
Spoke with Pt, informed him all labs were negative. Pt questioned whether to continue taking medications, informed him, it might be a good idea to finish round. Pt verbalized understanding.

## 2014-08-27 NOTE — Telephone Encounter (Signed)
Advise patient, tests are negative, good results

## 2014-11-11 ENCOUNTER — Telehealth: Payer: Self-pay | Admitting: Internal Medicine

## 2014-11-11 ENCOUNTER — Ambulatory Visit: Payer: Federal, State, Local not specified - PPO | Admitting: Internal Medicine

## 2014-11-11 NOTE — Telephone Encounter (Signed)
Per Dr. Drue NovelPaz, okay to charge No show fee.

## 2014-11-11 NOTE — Telephone Encounter (Signed)
NO SHOW FEE?

## 2014-11-11 NOTE — Telephone Encounter (Signed)
Pt had 6 mth follow up appt, it is a no show, should we charge no show?

## 2014-11-11 NOTE — Telephone Encounter (Signed)
Yes please

## 2014-11-12 ENCOUNTER — Encounter: Payer: Self-pay | Admitting: Internal Medicine

## 2014-11-20 ENCOUNTER — Emergency Department (HOSPITAL_COMMUNITY)
Admission: EM | Admit: 2014-11-20 | Discharge: 2014-11-20 | Disposition: A | Discharge status: Home / Self Care | Payer: Federal, State, Local not specified - PPO | Attending: Emergency Medicine | Admitting: Emergency Medicine

## 2014-11-20 ENCOUNTER — Emergency Department (HOSPITAL_COMMUNITY): Payer: Federal, State, Local not specified - PPO

## 2014-11-20 ENCOUNTER — Encounter (HOSPITAL_COMMUNITY): Payer: Self-pay | Admitting: Emergency Medicine

## 2014-11-20 DIAGNOSIS — S199XXA Unspecified injury of neck, initial encounter: Secondary | ICD-10-CM | POA: Diagnosis not present

## 2014-11-20 DIAGNOSIS — Y92410 Unspecified street and highway as the place of occurrence of the external cause: Secondary | ICD-10-CM | POA: Diagnosis not present

## 2014-11-20 DIAGNOSIS — Z79899 Other long term (current) drug therapy: Secondary | ICD-10-CM | POA: Insufficient documentation

## 2014-11-20 DIAGNOSIS — Z720 Tobacco use: Secondary | ICD-10-CM | POA: Insufficient documentation

## 2014-11-20 DIAGNOSIS — Z8659 Personal history of other mental and behavioral disorders: Secondary | ICD-10-CM | POA: Diagnosis not present

## 2014-11-20 DIAGNOSIS — Y9389 Activity, other specified: Secondary | ICD-10-CM | POA: Diagnosis not present

## 2014-11-20 DIAGNOSIS — J45909 Unspecified asthma, uncomplicated: Secondary | ICD-10-CM | POA: Insufficient documentation

## 2014-11-20 DIAGNOSIS — Y998 Other external cause status: Secondary | ICD-10-CM | POA: Diagnosis not present

## 2014-11-20 DIAGNOSIS — S0990XA Unspecified injury of head, initial encounter: Secondary | ICD-10-CM | POA: Diagnosis not present

## 2014-11-20 MED ORDER — NAPROXEN 500 MG PO TABS: 500.0000 mg | ORAL_TABLET | Freq: Two times a day (BID) | ORAL | Status: DC

## 2014-11-20 MED ORDER — HYDROCODONE-ACETAMINOPHEN 5-325 MG PO TABS
1.0000 | ORAL_TABLET | Freq: Once | ORAL | Status: AC
  Administered 2014-11-20: 1 via ORAL
  Filled 2014-11-20: qty 1

## 2014-11-20 MED ORDER — HYDROCODONE-ACETAMINOPHEN 5-325 MG PO TABS: 1.0000 | ORAL_TABLET | ORAL | Status: DC | PRN

## 2014-11-20 MED ORDER — PROCHLORPERAZINE EDISYLATE 5 MG/ML IJ SOLN
10.0000 mg | Freq: Four times a day (QID) | INTRAMUSCULAR | Status: DC | PRN
  Administered 2014-11-20: 10 mg via INTRAMUSCULAR
  Filled 2014-11-20: qty 2

## 2014-11-20 MED ORDER — IBUPROFEN 800 MG PO TABS
800.0000 mg | ORAL_TABLET | Freq: Once | ORAL | Status: AC
  Administered 2014-11-20: 800 mg via ORAL
  Filled 2014-11-20: qty 1

## 2014-11-20 MED ORDER — CYCLOBENZAPRINE HCL 10 MG PO TABS: 10.0000 mg | ORAL_TABLET | Freq: Two times a day (BID) | ORAL | Status: DC | PRN

## 2014-11-20 MED ORDER — DIPHENHYDRAMINE HCL 50 MG/ML IJ SOLN
50.0000 mg | Freq: Once | INTRAMUSCULAR | Status: AC
  Administered 2014-11-20: 50 mg via INTRAMUSCULAR
  Filled 2014-11-20: qty 1
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
## 2014-11-20 NOTE — ED Notes (Signed)
Awake. Verbally responsive. A/O x4. Resp even and unlabored. No audible adventitious breath sounds noted. ABC's intact.  
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
## 2014-11-22 ENCOUNTER — Encounter: Payer: Self-pay | Admitting: Internal Medicine

## 2014-11-22 ENCOUNTER — Telehealth: Payer: Self-pay | Admitting: Internal Medicine

## 2014-11-22 ENCOUNTER — Ambulatory Visit (INDEPENDENT_AMBULATORY_CARE_PROVIDER_SITE_OTHER): Payer: Federal, State, Local not specified - PPO | Admitting: Internal Medicine

## 2014-11-22 DIAGNOSIS — G43809 Other migraine, not intractable, without status migrainosus: Secondary | ICD-10-CM

## 2014-11-22 DIAGNOSIS — G43909 Migraine, unspecified, not intractable, without status migrainosus: Secondary | ICD-10-CM

## 2014-11-22 HISTORY — DX: Migraine, unspecified, not intractable, without status migrainosus: G43.909

## 2014-11-22 MED ORDER — PREDNISONE 10 MG PO TABS: ORAL_TABLET | ORAL | Status: DC

## 2014-11-22 MED ORDER — BUTALBITAL-APAP-CAFFEINE 50-325-40 MG PO TABS: 1.0000 | ORAL_TABLET | Freq: Four times a day (QID) | ORAL | Status: AC | PRN

## 2014-11-22 NOTE — Progress Notes (Signed)
Pre visit review using our clinic review tool, if applicable. No additional management support is needed unless otherwise documented below in the visit note. 
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
## 2014-11-22 NOTE — Telephone Encounter (Signed)
emmi emailed °

## 2014-11-22 NOTE — Patient Instructions (Signed)
Rest Naproxen as needed Take prednisone as prescribed  Fioricet as needed for migraines   if you get worse, the headache is not improving in the next 2-3 days, you have nausea vomiting: Please call the office

## 2014-11-24 ENCOUNTER — Encounter (HOSPITAL_COMMUNITY): Payer: Self-pay | Admitting: Emergency Medicine

## 2014-11-24 ENCOUNTER — Emergency Department (HOSPITAL_COMMUNITY)
Admission: EM | Admit: 2014-11-24 | Discharge: 2014-11-24 | Disposition: A | Discharge status: Home / Self Care | Payer: Federal, State, Local not specified - PPO | Attending: Emergency Medicine | Admitting: Emergency Medicine

## 2014-11-24 DIAGNOSIS — Z8659 Personal history of other mental and behavioral disorders: Secondary | ICD-10-CM | POA: Diagnosis not present

## 2014-11-24 DIAGNOSIS — Z79899 Other long term (current) drug therapy: Secondary | ICD-10-CM | POA: Insufficient documentation

## 2014-11-24 DIAGNOSIS — Z791 Long term (current) use of non-steroidal anti-inflammatories (NSAID): Secondary | ICD-10-CM | POA: Insufficient documentation

## 2014-11-24 DIAGNOSIS — R51 Headache: Secondary | ICD-10-CM | POA: Diagnosis present

## 2014-11-24 DIAGNOSIS — Z720 Tobacco use: Secondary | ICD-10-CM | POA: Diagnosis not present

## 2014-11-24 DIAGNOSIS — G44319 Acute post-traumatic headache, not intractable: Secondary | ICD-10-CM | POA: Diagnosis not present

## 2014-11-24 DIAGNOSIS — H9319 Tinnitus, unspecified ear: Secondary | ICD-10-CM | POA: Insufficient documentation

## 2014-11-24 DIAGNOSIS — J45909 Unspecified asthma, uncomplicated: Secondary | ICD-10-CM | POA: Diagnosis not present

## 2014-11-24 MED ORDER — METOCLOPRAMIDE HCL 5 MG/ML IJ SOLN
10.0000 mg | Freq: Once | INTRAMUSCULAR | Status: AC
  Administered 2014-11-24: 10 mg via INTRAMUSCULAR
  Filled 2014-11-24: qty 2

## 2014-11-24 MED ORDER — KETOROLAC TROMETHAMINE 60 MG/2ML IM SOLN
60.0000 mg | Freq: Once | INTRAMUSCULAR | Status: AC
  Administered 2014-11-24: 60 mg via INTRAMUSCULAR
  Filled 2014-11-24: qty 2

## 2014-11-24 NOTE — ED Notes (Signed)
Pt given sprite 
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
## 2014-12-06 ENCOUNTER — Telehealth: Payer: Self-pay | Admitting: Internal Medicine

## 2014-12-06 ENCOUNTER — Other Ambulatory Visit: Payer: Self-pay

## 2014-12-06 MED ORDER — VALACYCLOVIR HCL 500 MG PO TABS: 500.0000 mg | ORAL_TABLET | Freq: Two times a day (BID) | ORAL | Status: DC

## 2014-12-06 NOTE — Telephone Encounter (Signed)
[facility]

## 2014-12-06 NOTE — Telephone Encounter (Signed)
Caller name: Hrishikesh Relation to pt: self Call back number: [facility] [address]  Reason for call:   Requesting valACYclovir (VALTREX) refill.

## 2014-12-07 ENCOUNTER — Encounter (HOSPITAL_COMMUNITY): Payer: Self-pay | Admitting: Emergency Medicine

## 2014-12-07 ENCOUNTER — Emergency Department (HOSPITAL_COMMUNITY)
Admission: EM | Admit: 2014-12-07 | Discharge: 2014-12-07 | Disposition: A | Discharge status: Home / Self Care | Payer: Federal, State, Local not specified - PPO | Attending: Emergency Medicine | Admitting: Emergency Medicine

## 2014-12-07 DIAGNOSIS — Z113 Encounter for screening for infections with a predominantly sexual mode of transmission: Secondary | ICD-10-CM | POA: Diagnosis not present

## 2014-12-07 DIAGNOSIS — J45909 Unspecified asthma, uncomplicated: Secondary | ICD-10-CM | POA: Insufficient documentation

## 2014-12-07 DIAGNOSIS — Z7952 Long term (current) use of systemic steroids: Secondary | ICD-10-CM | POA: Insufficient documentation

## 2014-12-07 DIAGNOSIS — Z791 Long term (current) use of non-steroidal anti-inflammatories (NSAID): Secondary | ICD-10-CM | POA: Insufficient documentation

## 2014-12-07 DIAGNOSIS — Z8659 Personal history of other mental and behavioral disorders: Secondary | ICD-10-CM | POA: Insufficient documentation

## 2014-12-07 DIAGNOSIS — B009 Herpesviral infection, unspecified: Secondary | ICD-10-CM

## 2014-12-07 DIAGNOSIS — Z79899 Other long term (current) drug therapy: Secondary | ICD-10-CM | POA: Diagnosis not present

## 2014-12-07 DIAGNOSIS — Z720 Tobacco use: Secondary | ICD-10-CM | POA: Insufficient documentation

## 2014-12-07 DIAGNOSIS — G43909 Migraine, unspecified, not intractable, without status migrainosus: Secondary | ICD-10-CM | POA: Insufficient documentation

## 2014-12-07 DIAGNOSIS — Z202 Contact with and (suspected) exposure to infections with a predominantly sexual mode of transmission: Secondary | ICD-10-CM | POA: Diagnosis present

## 2014-12-07 LAB — URINALYSIS, ROUTINE W REFLEX MICROSCOPIC
Bilirubin Urine: NEGATIVE
GLUCOSE, UA: NEGATIVE mg/dL
Hgb urine dipstick: NEGATIVE
Ketones, ur: NEGATIVE mg/dL
LEUKOCYTES UA: NEGATIVE
NITRITE: NEGATIVE
PH: 6.5 (ref 5.0–8.0)
Protein, ur: NEGATIVE mg/dL
SPECIFIC GRAVITY, URINE: 1.026 (ref 1.005–1.030)
Urobilinogen, UA: 1 mg/dL (ref 0.0–1.0)

## 2014-12-07 MED ORDER — ACYCLOVIR 800 MG PO TABS: 400.0000 mg | ORAL_TABLET | Freq: Two times a day (BID) | ORAL | Status: DC

## 2014-12-07 NOTE — Discharge Instructions (Signed)
Herpes Simplex Herpes simplex is generally classified as Type 1 or Type 2. Type 1 is generally the type that is responsible for cold sores. Type 2 is generally associated with sexually transmitted diseases. We now know that most of the thoughts on these viruses are inaccurate. We find that HSV1 is also present genitally and HSV2 can be present orally, but this will vary in different locations of the world. Herpes simplex is usually detected by doing a culture. Blood tests are also available for this virus; however, the accuracy is often not as good.  PREPARATION FOR TEST No preparation or fasting is necessary. NORMAL FINDINGS  No virus present  No HSV antigens or antibodies present Ranges for normal findings may vary among different laboratories and hospitals. You should always check with your doctor after having lab work or other tests done to discuss the meaning of your test results and whether your values are considered within normal limits. MEANING OF TEST  Your caregiver will go over the test results with you and discuss the importance and meaning of your results, as well as treatment options and the need for additional tests if necessary. OBTAINING THE TEST RESULTS  It is your responsibility to obtain your test results. Ask the lab or department performing the test when and how you will get your results. Document Released: 10/27/2004 Document Revised: 12/17/2011 Document Reviewed: 09/04/2008 [employer] Patient Information 2015 [employer]. This information is not intended to replace advice given to you by your health care provider. Make sure you discuss any questions you have with your health care provider.  

## 2014-12-07 NOTE — ED Notes (Signed)
Pt reports exposure to sexual partner with discharge. Denies pain, denies burning on urination. small white raised bump noted on shaft of penis.

## 2014-12-07 NOTE — ED Provider Notes (Signed)
CSN: [id number]     Arrival date & time 12/07/14  1017 History   First MD Initiated Contact with Patient 12/07/14 1026     Chief Complaint  Patient presents with  . Exposure to STD    white pustule on penis, exposed to sexual partner withn discharge     (Consider location/radiation/quality/duration/timing/severity/associated sxs/prior Treatment) HPI Comments: Pt comes in with complaint of a bump on his penis. Has a history of herpes but is not sure that that is what this is. Pt states that his male partner has been having discharge and pelvic pain  Patient is a 19 y.o. male presenting with STD exposure. The history is provided by the patient. No language interpreter was used.  Exposure to STD This is a new problem. The current episode started in the past 7 days. The problem has been unchanged. Pertinent negatives include no fever. Nothing aggravates the symptoms. He has tried nothing for the symptoms.    Past Medical History  Diagnosis Date  . Anxiety and depression   . Bipolar 1 disorder   . ADHD (attention deficit hyperactivity disorder)   . Asthma   . Migraine headache 11/22/2014   Past Surgical History  Procedure Laterality Date  . Hernia repair      umbilical   Family History  Problem Relation Age of Onset  . Diabetes      gparents  . Cancer Neg Hx    History  Substance Use Topics  . Smoking status: Light Tobacco Smoker  . Smokeless tobacco: Never Used  . Alcohol Use: Yes     Comment: socially     Review of Systems  Constitutional: Negative for fever.  Respiratory: Negative.   Cardiovascular: Negative.   All other systems reviewed and are negative.     Allergies  Review of patient's allergies indicates no known allergies.  Home Medications   Prior to Admission medications   Medication Sig Start Date End Date Taking? Authorizing Provider  butalbital-acetaminophen-caffeine (FIORICET) 50-325-40 MG per tablet Take 1 tablet by mouth every 6 (six) hours as  needed for headache. Patient not taking: Reported on 11/24/2014 11/22/14 11/22/15  Wanda PlumpJose E Paz, MD  cyclobenzaprine (FLEXERIL) 10 MG tablet Take 1 tablet (10 mg total) by mouth 2 (two) times daily as needed for muscle spasms. 11/20/14   Tatyana A Kirichenko, PA-C  HYDROcodone-acetaminophen (NORCO/VICODIN) 5-325 MG per tablet Take 1-2 tablets by mouth every 4 (four) hours as needed for moderate pain or severe pain. 11/20/14   Tatyana A Kirichenko, PA-C  naproxen (NAPROSYN) 500 MG tablet Take 1 tablet (500 mg total) by mouth 2 (two) times daily. 11/20/14   Tatyana A Kirichenko, PA-C  predniSONE (DELTASONE) 10 MG tablet 4 tablets x 2 days, 3 tabs x 2 days, 2 tabs x 2 days, 1 tab x 2 days Patient not taking: Reported on 11/24/2014 11/22/14   Wanda PlumpJose E Paz, MD  valACYclovir (VALTREX) 500 MG tablet Take 1 tablet (500 mg total) by mouth 2 (two) times daily. For 3 days for each herpes flare up 12/06/14   Wanda PlumpJose E Paz, MD   BP 146/70 mmHg  Pulse 95  Temp(Src) 98.3 F (36.8 C) (Oral)  Resp 16  Wt 268 lb (121.564 kg)  SpO2 97% Physical Exam  Constitutional: He is oriented to person, place, and time. He appears well-developed and well-nourished.  Cardiovascular: Normal rate and regular rhythm.   Pulmonary/Chest: Effort normal and breath sounds normal.  Abdominal: Soft. Bowel sounds are normal. There is no  tenderness.  Genitourinary:  Small indurated draining area to the shaft of the penis:no penile discharge noted  Neurological: He is alert and oriented to person, place, and time.  Skin: Skin is warm and dry.  Nursing note and vitals reviewed.   ED Course  Procedures (including critical care time) Labs Review Labs Reviewed  URINALYSIS, ROUTINE W REFLEX MICROSCOPIC  HIV ANTIBODY (ROUTINE TESTING)  RPR  GC/CHLAMYDIA PROBE AMP ([place])    Imaging Review No results found.   EKG Interpretation None      MDM   Final diagnoses:  Herpes  Screening for STD (sexually transmitted disease)   Pt  given script for herpes. Std culture sent.      Teressa Lower, NP 12/07/14 1124  Layla Maw Ward, DO 12/07/14 [phone]

## 2014-12-08 LAB — HIV ANTIBODY (ROUTINE TESTING W REFLEX): HIV Screen 4th Generation wRfx: NONREACTIVE

## 2014-12-08 LAB — RPR: RPR Ser Ql: NONREACTIVE

## 2014-12-08 LAB — GC/CHLAMYDIA PROBE AMP (~~LOC~~) NOT AT ARMC
Chlamydia: NEGATIVE
NEISSERIA GONORRHEA: NEGATIVE

## 2015-02-01 ENCOUNTER — Encounter (HOSPITAL_COMMUNITY): Payer: Self-pay | Admitting: Emergency Medicine

## 2015-02-01 ENCOUNTER — Emergency Department (HOSPITAL_COMMUNITY)
Admission: EM | Admit: 2015-02-01 | Discharge: 2015-02-01 | Disposition: A | Discharge status: Home / Self Care | Payer: Federal, State, Local not specified - PPO | Attending: Emergency Medicine | Admitting: Emergency Medicine

## 2015-02-01 DIAGNOSIS — Z720 Tobacco use: Secondary | ICD-10-CM | POA: Diagnosis not present

## 2015-02-01 DIAGNOSIS — Z8659 Personal history of other mental and behavioral disorders: Secondary | ICD-10-CM | POA: Diagnosis not present

## 2015-02-01 DIAGNOSIS — Z79899 Other long term (current) drug therapy: Secondary | ICD-10-CM | POA: Insufficient documentation

## 2015-02-01 DIAGNOSIS — G43909 Migraine, unspecified, not intractable, without status migrainosus: Secondary | ICD-10-CM | POA: Insufficient documentation

## 2015-02-01 DIAGNOSIS — Z791 Long term (current) use of non-steroidal anti-inflammatories (NSAID): Secondary | ICD-10-CM | POA: Diagnosis not present

## 2015-02-01 DIAGNOSIS — M545 Low back pain, unspecified: Secondary | ICD-10-CM

## 2015-02-01 DIAGNOSIS — J45909 Unspecified asthma, uncomplicated: Secondary | ICD-10-CM | POA: Insufficient documentation

## 2015-02-01 DIAGNOSIS — M549 Dorsalgia, unspecified: Secondary | ICD-10-CM | POA: Diagnosis present

## 2015-02-01 MED ORDER — NAPROXEN 500 MG PO TABS: 500.0000 mg | ORAL_TABLET | Freq: Two times a day (BID) | ORAL | Status: DC

## 2015-02-01 MED ORDER — NAPROXEN 500 MG PO TABS
500.0000 mg | ORAL_TABLET | Freq: Once | ORAL | Status: AC
  Administered 2015-02-01: 500 mg via ORAL
  Filled 2015-02-01: qty 1

## 2015-02-01 NOTE — Discharge Instructions (Signed)
Take naproxen as prescribed. Rest, apply heat intermittently. Avoid heavy lifting or hard physical activity for the next 2-3 days.  Back Pain, Adult Low back pain is very common. About 1 in 5 people have back pain.The cause of low back pain is rarely dangerous. The pain often gets better over time.About half of people with a sudden onset of back pain feel better in just 2 weeks. About 8 in 10 people feel better by 6 weeks.  CAUSES Some common causes of back pain include:  Strain of the muscles or ligaments supporting the spine.  Wear and tear (degeneration) of the spinal discs.  Arthritis.  Direct injury to the back. DIAGNOSIS Most of the time, the direct cause of low back pain is not known.However, back pain can be treated effectively even when the exact cause of the pain is unknown.Answering your caregiver's questions about your overall health and symptoms is one of the most accurate ways to make sure the cause of your pain is not dangerous. If your caregiver needs more information, he or she may order lab work or imaging tests (X-rays or MRIs).However, even if imaging tests show changes in your back, this usually does not require surgery. HOME CARE INSTRUCTIONS For many people, back pain returns.Since low back pain is rarely dangerous, it is often a condition that people can learn to [facility] their own.   Remain active. It is stressful on the back to sit or stand in one place. Do not sit, drive, or stand in one place for more than 30 minutes at a time. Take short walks on level surfaces as soon as pain allows.Try to increase the length of time you walk each day.  Do not stay in bed.Resting more than 1 or 2 days can delay your recovery.  Do not avoid exercise or work.Your body is made to move.It is not dangerous to be active, even though your back may hurt.Your back will likely heal faster if you return to being active before your pain is gone.  Pay attention to your body when  you bend and lift. Many people have less discomfortwhen lifting if they bend their knees, keep the load close to their bodies,and avoid twisting. Often, the most comfortable positions are those that put less stress on your recovering back.  Find a comfortable position to sleep. Use a firm mattress and lie on your side with your knees slightly bent. If you lie on your back, put a pillow under your knees.  Only take over-the-counter or prescription medicines as directed by your caregiver. Over-the-counter medicines to reduce pain and inflammation are often the most helpful.Your caregiver may prescribe muscle relaxant drugs.These medicines help dull your pain so you can more quickly return to your normal activities and healthy exercise.  Put ice on the injured area.  Put ice in a plastic bag.  Place a towel between your skin and the bag.  Leave the ice on for 15-20 minutes, 03-04 times a day for the first 2 to 3 days. After that, ice and heat may be alternated to reduce pain and spasms.  Ask your caregiver about trying back exercises and gentle massage. This may be of some benefit.  Avoid feeling anxious or stressed.Stress increases muscle tension and can worsen back pain.It is important to recognize when you are anxious or stressed and learn ways to manage it.Exercise is a great option. SEEK MEDICAL CARE IF:  You have pain that is not relieved with rest or medicine.  You have  pain that does not improve in 1 week.  You have new symptoms.  You are generally not feeling well. SEEK IMMEDIATE MEDICAL CARE IF:   You have pain that radiates from your back into your legs.  You develop new bowel or bladder control problems.  You have unusual weakness or numbness in your arms or legs.  You develop nausea or vomiting.  You develop abdominal pain.  You feel faint. Document Released: 09/24/2005 Document Revised: 03/25/2012 Document Reviewed: 01/26/2014 [facility]ExitCare Patient Information  2015 [place], [place]. This information is not intended to replace advice given to you by your health care provider. Make sure you discuss any questions you have with your health care provider. Musculoskeletal Pain Musculoskeletal pain is muscle and boney aches and pains. These pains can occur in any part of the body. Your caregiver may treat you without knowing the cause of the pain. They may treat you if blood or urine tests, X-rays, and other tests were normal.  CAUSES There is often not a definite cause or reason for these pains. These pains may be caused by a type of germ (virus). The discomfort may also come from overuse. Overuse includes working out too hard when your body is not fit. Boney aches also come from weather changes. Bone is sensitive to atmospheric pressure changes. HOME CARE INSTRUCTIONS   Ask when your test results will be ready. Make sure you get your test results.  Only take over-the-counter or prescription medicines for pain, discomfort, or fever as directed by your caregiver. If you were given medications for your condition, do not drive, operate machinery or power tools, or sign legal documents for 24 hours. Do not drink alcohol. Do not take sleeping pills or other medications that may interfere with treatment.  Continue all activities unless the activities cause more pain. When the pain lessens, slowly resume normal activities. Gradually increase the intensity and duration of the activities or exercise.  During periods of severe pain, bed rest may be helpful. Lay or sit in any position that is comfortable.  Putting ice on the injured area.  Put ice in a bag.  Place a towel between your skin and the bag.  Leave the ice on for 15 to 20 minutes, 3 to 4 times a day.  Follow up with your caregiver for continued problems and no reason can be found for the pain. If the pain becomes worse or does not go away, it may be necessary to repeat tests or do additional testing. Your  caregiver may need to look further for a possible cause. SEEK IMMEDIATE MEDICAL CARE IF:  You have pain that is getting worse and is not relieved by medications.  You develop chest pain that is associated with shortness or breath, sweating, feeling sick to your stomach (nauseous), or throw up (vomit).  Your pain becomes localized to the abdomen.  You develop any new symptoms that seem different or that concern you. MAKE SURE YOU:   Understand these instructions.  Will watch your condition.  Will get help right away if you are not doing well or get worse. Document Released: 09/24/2005 Document Revised: 12/17/2011 Document Reviewed: 05/29/2013 [facility] Patient Information 2015 [place], [place]. This information is not intended to replace advice given to you by your health care provider. Make sure you discuss any questions you have with your health care provider.

## 2015-02-01 NOTE — ED Provider Notes (Signed)
CSN: [id number]     Arrival date & time 02/01/15  1149 History  This chart was scribed for non-physician practitioner, Celene Skeen, PA-C , working with Shon Baton, MD by Charline Bills, ED Scribe. This patient was seen in room WTR6/WTR6 and the patient's care was started at 12:21 PM.   Chief Complaint  Patient presents with  . Back Pain    1 day hx of l/side pain   The history is provided by the patient. No language interpreter was used.   HPI Comments: [name] is a 19 y.o. male, with a h/o asthma, anxiety, depression, bipolar, who presents to the Emergency Department complaining of acute onset of L-sided back pain since yesterday morning. Pt reports a gradually worsening throbbing, pulsating sensation that is exacerbated with movement, touching and breathing. He denies urinary or bowel incontinence. No medications tried PTA. No other alleviating or aggravating factors. No known injury or trauma. He works at [employer] in [employer].  Past Medical History  Diagnosis Date  . Anxiety and depression   . Bipolar 1 disorder   . ADHD (attention deficit hyperactivity disorder)   . Asthma   . Migraine headache 11/22/2014   Past Surgical History  Procedure Laterality Date  . Hernia repair      umbilical   Family History  Problem Relation Age of Onset  . Diabetes      gparents  . Cancer Neg Hx   . Hypertension Mother   . Hypertension Father    History  Substance Use Topics  . Smoking status: Current Every Day Smoker  . Smokeless tobacco: Never Used  . Alcohol Use: Yes     Comment: socially     Review of Systems  Musculoskeletal: Positive for back pain.  All other systems reviewed and are negative.  Allergies  Review of patient's allergies indicates no known allergies.  Home Medications   Prior to Admission medications   Medication Sig Start Date End Date Taking? Authorizing Provider  acyclovir (ZOVIRAX) 800 MG tablet Take 0.5 tablets (400 mg total)  by mouth 2 (two) times daily. 12/07/14   Teressa Lower, NP  butalbital-acetaminophen-caffeine (FIORICET) 50-325-40 MG per tablet Take 1 tablet by mouth every 6 (six) hours as needed for headache. Patient not taking: Reported on 11/24/2014 11/22/14 11/22/15  Wanda Plump, MD  cyclobenzaprine (FLEXERIL) 10 MG tablet Take 1 tablet (10 mg total) by mouth 2 (two) times daily as needed for muscle spasms. Patient taking differently: Take 10 mg by mouth at bedtime as needed for muscle spasms.  11/20/14   Tatyana Kirichenko, PA-C  HYDROcodone-acetaminophen (NORCO/VICODIN) 5-325 MG per tablet Take 1-2 tablets by mouth every 4 (four) hours as needed for moderate pain or severe pain. Patient not taking: Reported on 12/07/2014 11/20/14   Tatyana Kirichenko, PA-C  naproxen (NAPROSYN) 500 MG tablet Take 1 tablet (500 mg total) by mouth 2 (two) times daily. 11/20/14   Tatyana Kirichenko, PA-C  naproxen (NAPROSYN) 500 MG tablet Take 1 tablet (500 mg total) by mouth 2 (two) times daily. 02/01/15   Seleta Hovland M Paulette Lynch, PA-C  predniSONE (DELTASONE) 10 MG tablet 4 tablets x 2 days, 3 tabs x 2 days, 2 tabs x 2 days, 1 tab x 2 days Patient not taking: Reported on 11/24/2014 11/22/14   Wanda Plump, MD  valACYclovir (VALTREX) 500 MG tablet Take 1 tablet (500 mg total) by mouth 2 (two) times daily. For 3 days for each herpes flare up 12/06/14   [facility]  E Paz, MD   BP 134/87 mmHg  Pulse 97  Temp(Src) 98.8 F (37.1 C) (Oral)  Resp 20  SpO2 99% Physical Exam  Constitutional: He is oriented to person, place, and time. He appears well-developed and well-nourished. No distress.  HENT:  Head: Normocephalic and atraumatic.  Mouth/Throat: Oropharynx is clear and moist.  Eyes: Conjunctivae are normal.  Neck: Normal range of motion. Neck supple. No spinous process tenderness and no muscular tenderness present.  Cardiovascular: Normal rate, regular rhythm and normal heart sounds.   Pulmonary/Chest: Effort normal and breath sounds normal. No  respiratory distress.  Abdominal:  No CVAT.  Musculoskeletal: He exhibits no edema.  TTP left lower lumbar paraspinal muscles and muscles over SI joint. No spinous process tenderness. FROM.  Neurological: He is alert and oriented to person, place, and time. He has normal strength.  Strength lower extremities 5/5 and equal bilateral. Sensation intact. Normal gait.  Skin: Skin is warm and dry. No rash noted. He is not diaphoretic.  Psychiatric: He has a normal mood and affect. His behavior is normal.  Nursing note and vitals reviewed.  ED Course  Procedures (including critical care time) DIAGNOSTIC STUDIES: Oxygen Saturation is 99% on RA, normal by my interpretation.    COORDINATION OF CARE: 12:24 PM-Discussed treatment plan which includes Naproxen and heat with pt at bedside and pt agreed to plan.   Labs Review Labs Reviewed - No data to display  Imaging Review No results found.   EKG Interpretation None      MDM   Final diagnoses:  Left-sided low back pain without sciatica   NAD. No red flags concerning patient's back pain. No s/s of central cord compression or cauda equina. Lower extremities are neurovascularly intact and patient is ambulating without difficulty. No bony tenderness. VSS. Rx naproxen. Stable for d/c. F/u with PCP. Return precautions given. Patient states understanding of treatment care plan and is agreeable.  I personally performed the services described in this documentation, which was scribed in my presence. The recorded information has been reviewed and is accurate.  Kathrynn SpeedRobyn M Elex Mainwaring, PA-C 02/01/15 1240  Shon Batonourtney F Horton, MD 02/01/15 2037

## 2015-02-01 NOTE — ED Notes (Signed)
Pt reports that he awoke yesterday with sharp pain in l/mid back. Denies trauma, denies over use
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
## 2015-07-06 ENCOUNTER — Emergency Department (HOSPITAL_COMMUNITY)
Admission: EM | Admit: 2015-07-06 | Discharge: 2015-07-07 | Disposition: A | Discharge status: Home / Self Care | Payer: Federal, State, Local not specified - PPO | Attending: Emergency Medicine | Admitting: Emergency Medicine

## 2015-07-06 ENCOUNTER — Encounter (HOSPITAL_COMMUNITY): Payer: Self-pay

## 2015-07-06 DIAGNOSIS — Y9389 Activity, other specified: Secondary | ICD-10-CM | POA: Diagnosis not present

## 2015-07-06 DIAGNOSIS — Y92480 Sidewalk as the place of occurrence of the external cause: Secondary | ICD-10-CM | POA: Insufficient documentation

## 2015-07-06 DIAGNOSIS — S0990XA Unspecified injury of head, initial encounter: Secondary | ICD-10-CM | POA: Insufficient documentation

## 2015-07-06 DIAGNOSIS — Z8659 Personal history of other mental and behavioral disorders: Secondary | ICD-10-CM | POA: Insufficient documentation

## 2015-07-06 DIAGNOSIS — G43909 Migraine, unspecified, not intractable, without status migrainosus: Secondary | ICD-10-CM | POA: Insufficient documentation

## 2015-07-06 DIAGNOSIS — Z720 Tobacco use: Secondary | ICD-10-CM | POA: Diagnosis not present

## 2015-07-06 DIAGNOSIS — W01198A Fall on same level from slipping, tripping and stumbling with subsequent striking against other object, initial encounter: Secondary | ICD-10-CM | POA: Diagnosis not present

## 2015-07-06 DIAGNOSIS — Y998 Other external cause status: Secondary | ICD-10-CM | POA: Diagnosis not present

## 2015-07-06 DIAGNOSIS — Z791 Long term (current) use of non-steroidal anti-inflammatories (NSAID): Secondary | ICD-10-CM | POA: Insufficient documentation

## 2015-07-06 DIAGNOSIS — J45909 Unspecified asthma, uncomplicated: Secondary | ICD-10-CM | POA: Diagnosis not present

## 2015-07-06 NOTE — ED Notes (Signed)
Pt hit his head last week and since then he's had an intermittent headache and nothing will help it.

## 2015-07-07 DIAGNOSIS — S0990XA Unspecified injury of head, initial encounter: Secondary | ICD-10-CM | POA: Diagnosis not present

## 2015-07-07 MED ORDER — KETOROLAC TROMETHAMINE 30 MG/ML IJ SOLN
30.0000 mg | Freq: Once | INTRAMUSCULAR | Status: AC
  Administered 2015-07-07: 30 mg via INTRAVENOUS
  Filled 2015-07-07: qty 1

## 2015-07-07 MED ORDER — METOCLOPRAMIDE HCL 10 MG PO TABS: 10.0000 mg | ORAL_TABLET | Freq: Three times a day (TID) | ORAL | Status: DC | PRN

## 2015-07-07 MED ORDER — METHYLPREDNISOLONE SODIUM SUCC 125 MG IJ SOLR
125.0000 mg | Freq: Once | INTRAMUSCULAR | Status: AC
  Administered 2015-07-07: 125 mg via INTRAVENOUS
  Filled 2015-07-07: qty 2

## 2015-07-07 MED ORDER — SODIUM CHLORIDE 0.9 % IV BOLUS (SEPSIS)
1000.0000 mL | Freq: Once | INTRAVENOUS | Status: AC
  Administered 2015-07-07: 1000 mL via INTRAVENOUS

## 2015-07-07 MED ORDER — DIPHENHYDRAMINE HCL 50 MG/ML IJ SOLN
25.0000 mg | Freq: Once | INTRAMUSCULAR | Status: AC
  Administered 2015-07-07: 25 mg via INTRAVENOUS
  Filled 2015-07-07: qty 1

## 2015-07-07 MED ORDER — METOCLOPRAMIDE HCL 5 MG/ML IJ SOLN
10.0000 mg | Freq: Once | INTRAMUSCULAR | Status: AC
  Administered 2015-07-07: 10 mg via INTRAVENOUS
  Filled 2015-07-07: qty 2
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
## 2015-07-07 NOTE — Discharge Instructions (Signed)
°Concussion °A concussion is a brain injury. It is caused by: °· A hit to the head. °· A quick and sudden movement (jolt) of the head or neck. °A concussion is usually not life threatening. Even so, it can cause serious problems. If you had a concussion before, you may have concussion-like problems after a hit to your head. °HOME CARE °General Instructions °· Follow your doctor's directions carefully. °· Take medicines only as told by your doctor. °· Only take medicines your doctor says are safe. °· Do not drink alcohol until your doctor says it is okay. Alcohol and some drugs can slow down healing. They can also put you at risk for further injury. °· If you are having trouble remembering things, write them down. °· Try to do one thing at a time if you get distracted easily. For example, do not watch TV while making dinner. °· Talk to your family members or close friends when making important decisions. °· Follow up with your doctor as told. °· Watch your symptoms. Tell others to do the same. Serious problems can sometimes happen after a concussion. Older adults are more likely to have these problems. °· Tell your teachers, school nurse, school counselor, coach, athletic trainer, or work manager about your concussion. Tell them about what you can or cannot do. They should watch to see if: °¨ It gets even harder for you to pay attention or concentrate. °¨ It gets even harder for you to remember things or learn new things. °¨ You need more time than normal to finish things. °¨ You become annoyed (irritable) more than before. °¨ You are not able to deal with stress as well. °¨ You have more problems than before. °· Rest. Make sure you: °¨ Get plenty of sleep at night. °¨ Go to sleep early. °¨ Go to bed at the same time every day. Try to wake up at the same time. °¨ Rest during the day. °¨ Take naps when you feel tired. °· Limit activities where you have to think a lot or concentrate. These include: °¨ Doing  homework. °¨ Doing work related to a job. °¨ Watching TV. °¨ Using the computer. °Returning To Your Regular Activities °Return to your normal activities slowly, not all at once. You must give your body and brain enough time to heal.  °· Do not play sports or do other athletic activities until your doctor says it is okay. °· Ask your doctor when you can drive, ride a bicycle, or work other vehicles or machines. Never do these things if you feel dizzy. °· Ask your doctor about when you can return to work or school. °Preventing Another Concussion °It is very important to avoid another brain injury, especially before you have healed. In rare cases, another injury can lead to permanent brain damage, brain swelling, or death. The risk of this is greatest during the first 7-10 days after your injury. Avoid injuries by:  °· Wearing a seat belt when riding in a car. °· Not drinking too much alcohol. °· Avoiding activities that could lead to a second concussion (such as contact sports). °· Wearing a helmet when doing activities like: °¨ Biking. °¨ Skiing. °¨ Skateboarding. °¨ Skating. °· Making your home safer by: °¨ Removing things from the floor or stairways that could make you trip. °¨ Using grab bars in bathrooms and handrails by stairs. °¨ Placing non-slip mats on floors and in bathtubs. °¨ Improve lighting in dark areas. °GET HELP IF: °· It   gets even harder for you to pay attention or concentrate.  It gets even harder for you to remember things or learn new things.  You need more time than normal to finish things.  You become annoyed (irritable) more than before.  You are not able to deal with stress as well.  You have more problems than before.  You have problems keeping your balance.  You are not able to react quickly when you should. Get help if you have any of these problems for more than 2 weeks:   Lasting (chronic) headaches.  Dizziness or trouble balancing.  Feeling sick to your stomach  (nausea).  Seeing (vision) problems.  Being affected by noises or light more than normal.  Feeling sad, low, down in the dumps, blue, gloomy, or empty (depressed).  Mood changes (mood swings).  Feeling of fear or nervousness about what may happen (anxiety).  Feeling annoyed.  Memory problems.  Problems concentrating or paying attention.  Sleep problems.  Feeling tired all the time. GET HELP RIGHT AWAY IF:   You have bad headaches or your headaches get worse.  You have weakness (even if it is in one hand, leg, or part of the face).  You have loss of feeling (numbness).  You feel off balance.  You keep throwing up (vomiting).  You feel tired.  One black center of your eye (pupil) is larger than the other.  You twitch or shake violently (convulse).  Your speech is not clear (slurred).  You are more confused, easily angered (agitated), or annoyed than before.  You have more trouble resting than before.  You are unable to recognize people or places.  You have neck pain.  It is difficult to wake you up.  You have unusual behavior changes.  You pass out (lose consciousness). MAKE SURE YOU:   Understand these instructions.  Will watch your condition.  Will get help right away if you are not doing well or get worse. Document Released: 09/12/2009 Document Revised: 02/08/2014 Document Reviewed: 04/16/2013 [facility] Patient Information 2015 [place], [place]. This information is not intended to replace advice given to you by your health care provider. Make sure you discuss any questions you have with your health care provider.  Migraine Headache A migraine headache is an intense, throbbing pain on one or both sides of your head. A migraine can last for 30 minutes to several hours. CAUSES  The exact cause of a migraine headache is not always known. However, a migraine may be caused when nerves in the brain become irritated and release chemicals that cause  inflammation. This causes pain. Certain things may also trigger migraines, such as:  Alcohol.  Smoking.  Stress.  Menstruation.  Aged cheeses.  Foods or drinks that contain nitrates, glutamate, aspartame, or tyramine.  Lack of sleep.  Chocolate.  Caffeine.  Hunger.  Physical exertion.  Fatigue.  Medicines used to treat chest pain (nitroglycerine), birth control pills, estrogen, and some blood pressure medicines. SIGNS AND SYMPTOMS  Pain on one or both sides of your head.  Pulsating or throbbing pain.  Severe pain that prevents daily activities.  Pain that is aggravated by any physical activity.  Nausea, vomiting, or both.  Dizziness.  Pain with exposure to bright lights, loud noises, or activity.  General sensitivity to bright lights, loud noises, or smells. Before you get a migraine, you may get warning signs that a migraine is coming (aura). An aura may include:  Seeing flashing lights.  Seeing bright spots, halos,  or zigzag lines.  Having tunnel vision or blurred vision.  Having feelings of numbness or tingling.  Having trouble talking.  Having muscle weakness. DIAGNOSIS  A migraine headache is often diagnosed based on:  Symptoms.  Physical exam.  A CT scan or MRI of your head. These imaging tests cannot diagnose migraines, but they can help rule out other causes of headaches. TREATMENT Medicines may be given for pain and nausea. Medicines can also be given to help prevent recurrent migraines.  HOME CARE INSTRUCTIONS  Only take over-the-counter or prescription medicines for pain or discomfort as directed by your health care provider. The use of long-term narcotics is not recommended.  Lie down in a dark, quiet room when you have a migraine.  Keep a journal to find out what may trigger your migraine headaches. For example, write down:  What you eat and drink.  How much sleep you get.  Any change to your diet or medicines.  Limit  alcohol consumption.  Quit smoking if you smoke.  Get 7-9 hours of sleep, or as recommended by your health care provider.  Limit stress.  Keep lights dim if bright lights bother you and make your migraines worse. SEEK IMMEDIATE MEDICAL CARE IF:   Your migraine becomes severe.  You have a fever.  You have a stiff neck.  You have vision loss.  You have muscular weakness or loss of muscle control.  You start losing your balance or have trouble walking.  You feel faint or pass out.  You have severe symptoms that are different from your first symptoms. MAKE SURE YOU:   Understand these instructions.  Will watch your condition.  Will get help right away if you are not doing well or get worse. Document Released: 09/24/2005 Document Revised: 02/08/2014 Document Reviewed: 06/01/2013 [facility] Patient Information 2015 [place], [place]. This information is not intended to replace advice given to you by your health care provider. Make sure you discuss any questions you have with your health care provider.

## 2015-08-11 ENCOUNTER — Encounter (HOSPITAL_COMMUNITY): Payer: Self-pay | Admitting: Psychiatry

## 2015-08-11 ENCOUNTER — Other Ambulatory Visit (HOSPITAL_COMMUNITY): Payer: Self-pay | Admitting: Psychiatry

## 2015-08-11 ENCOUNTER — Ambulatory Visit (INDEPENDENT_AMBULATORY_CARE_PROVIDER_SITE_OTHER): Payer: Federal, State, Local not specified - PPO | Admitting: Psychiatry

## 2015-08-11 VITALS — BP 127/86 | HR 98 | Ht 72.0 in | Wt 283.6 lb

## 2015-08-11 DIAGNOSIS — F332 Major depressive disorder, recurrent severe without psychotic features: Secondary | ICD-10-CM | POA: Insufficient documentation

## 2015-08-11 DIAGNOSIS — F411 Generalized anxiety disorder: Secondary | ICD-10-CM | POA: Insufficient documentation

## 2015-08-11 DIAGNOSIS — Z0000 Encounter for general adult medical examination without abnormal findings: Secondary | ICD-10-CM

## 2015-08-11 MED ORDER — QUETIAPINE FUMARATE 100 MG PO TABS: 100.0000 mg | ORAL_TABLET | Freq: Every day | ORAL | Status: DC

## 2015-08-11 MED ORDER — BUPROPION HCL ER (XL) 150 MG PO TB24: 150.0000 mg | ORAL_TABLET | Freq: Every day | ORAL | Status: DC
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
## 2015-08-11 NOTE — Patient Instructions (Signed)
1. Labs 2. EKG call [phone]  

## 2015-08-15 LAB — CBC
HEMATOCRIT: 45.2 % (ref 39.0–52.0)
HEMOGLOBIN: 15.9 g/dL (ref 13.0–17.0)
MCH: 30.2 pg (ref 26.0–34.0)
MCHC: 35.2 g/dL (ref 30.0–36.0)
MCV: 85.9 fL (ref 78.0–100.0)
MPV: 9.9 fL (ref 8.6–12.4)
PLATELETS: 156 10*3/uL (ref 150–400)
RBC: 5.26 MIL/uL (ref 4.22–5.81)
RDW: 14.1 % (ref 11.5–15.5)
WBC: 8.2 10*3/uL (ref 4.0–10.5)

## 2015-08-15 LAB — HEMOGLOBIN A1C
HEMOGLOBIN A1C: 5.3 % (ref <5.7)
Mean Plasma Glucose: 105 mg/dL (ref <117)

## 2015-08-16 LAB — COMPREHENSIVE METABOLIC PANEL
ALT: 22 U/L (ref 9–46)
AST: 20 U/L (ref 10–40)
Albumin: 4.4 g/dL (ref 3.6–5.1)
Alkaline Phosphatase: 73 U/L (ref 40–115)
BILIRUBIN TOTAL: 0.6 mg/dL (ref 0.2–1.2)
BUN: 15 mg/dL (ref 7–25)
CO2: 23 mmol/L (ref 20–31)
CREATININE: 0.98 mg/dL (ref 0.60–1.35)
Calcium: 9.4 mg/dL (ref 8.6–10.3)
Chloride: 106 mmol/L (ref 98–110)
GLUCOSE: 90 mg/dL (ref 65–99)
Potassium: 3.9 mmol/L (ref 3.5–5.3)
Sodium: 141 mmol/L (ref 135–146)
TOTAL PROTEIN: 6.9 g/dL (ref 6.1–8.1)

## 2015-08-16 LAB — LIPID PANEL
Cholesterol: 230 mg/dL — ABNORMAL HIGH (ref 125–170)
HDL: 45 mg/dL (ref >=40)
LDL CALC: 154 mg/dL — AB (ref <110)
TRIGLYCERIDES: 154 mg/dL — AB (ref <150)
Total CHOL/HDL Ratio: 5.1 Ratio — ABNORMAL HIGH (ref <=5.0)
VLDL: 31 mg/dL — AB (ref <30)

## 2015-08-16 LAB — PROLACTIN: PROLACTIN: 13 ng/mL (ref 2.1–17.1)

## 2015-08-16 LAB — TSH: TSH: 1.494 u[IU]/mL (ref 0.350–4.500)

## 2015-09-27 ENCOUNTER — Ambulatory Visit (HOSPITAL_COMMUNITY): Payer: Self-pay | Admitting: Psychiatry

## 2015-10-05 ENCOUNTER — Encounter (HOSPITAL_COMMUNITY): Payer: Self-pay | Admitting: Emergency Medicine

## 2015-10-05 ENCOUNTER — Emergency Department (INDEPENDENT_AMBULATORY_CARE_PROVIDER_SITE_OTHER)
Admission: EM | Admit: 2015-10-05 | Discharge: 2015-10-05 | Disposition: A | Discharge status: Home / Self Care | Payer: Federal, State, Local not specified - PPO | Source: Home / Self Care | Attending: Emergency Medicine | Admitting: Emergency Medicine

## 2015-10-05 ENCOUNTER — Emergency Department (HOSPITAL_COMMUNITY)
Admission: EM | Admit: 2015-10-05 | Discharge: 2015-10-05 | Discharge status: Absent without leave | Payer: Federal, State, Local not specified - PPO

## 2015-10-05 DIAGNOSIS — J40 Bronchitis, not specified as acute or chronic: Secondary | ICD-10-CM

## 2015-10-05 MED ORDER — ALBUTEROL SULFATE HFA 108 (90 BASE) MCG/ACT IN AERS: 2.0000 | INHALATION_SPRAY | RESPIRATORY_TRACT | Status: DC | PRN

## 2015-10-05 MED ORDER — HYDROCODONE-HOMATROPINE 5-1.5 MG/5ML PO SYRP: 5.0000 mL | ORAL_SOLUTION | Freq: Four times a day (QID) | ORAL | Status: DC | PRN

## 2015-10-05 MED ORDER — PREDNISONE 50 MG PO TABS: ORAL_TABLET | ORAL | Status: DC

## 2015-10-05 NOTE — ED Provider Notes (Signed)
CSN: [id number]     Arrival date & time 10/05/15  1301 History   First MD Initiated Contact with Patient 10/05/15 1319     Chief Complaint  Patient presents with  . Cough  . URI   (Consider location/radiation/quality/duration/timing/severity/associated sxs/prior Treatment) HPI The 19 year old man here for evaluation of cough. He states his symptoms started 2 days ago with nasal congestion, rhinorrhea, sore throat, and cough. He does report some intermittent shortness of breath and wheezing. He reports some chest pain with coughing. He denies any fevers or chills. He reports posttussive emesis of mucus. He denies any nausea. He is eating and drinking well.  He has tried several over-the-counter medications without improvement.  Past Medical History  Diagnosis Date  . Anxiety and depression   . Bipolar 1 disorder (HCC)   . ADHD (attention deficit hyperactivity disorder)   . Asthma   . Migraine headache 11/22/2014  . Anxiety    Past Surgical History  Procedure Laterality Date  . Hernia repair      umbilical   Family History  Problem Relation Age of Onset  . Diabetes      gparents  . Cancer Neg Hx   . Alcohol abuse Neg Hx   . Drug abuse Neg Hx   . Hypertension Mother   . Hypertension Father   . Schizophrenia Paternal Uncle    Social History  Substance Use Topics  . Smoking status: Current Every Day Smoker -- 1.00 packs/day    Types: Cigarettes  . Smokeless tobacco: Never Used  . Alcohol Use: Yes     Comment: 3x/week up to 4 drinks at a time    Review of Systems As in history of present illness Allergies  Review of patient's allergies indicates no known allergies.  Home Medications   Prior to Admission medications   Medication Sig Start Date End Date Taking? Authorizing Provider  acetaminophen (TYLENOL) 500 MG tablet Take 500 mg by mouth every 6 (six) hours as needed for headache.    Historical Provider, MD  acyclovir (ZOVIRAX) 800 MG tablet Take 0.5 tablets (400 mg  total) by mouth 2 (two) times daily. 12/07/14   Teressa Lower, NP  albuterol (PROVENTIL HFA;VENTOLIN HFA) 108 (90 Base) MCG/ACT inhaler Inhale 2 puffs into the lungs every 4 (four) hours as needed for wheezing or shortness of breath (cough). 10/05/15   Charm Rings, MD  buPROPion (WELLBUTRIN XL) 150 MG 24 hr tablet Take 1 tablet (150 mg total) by mouth daily. 08/11/15   Oletta Darter, MD  butalbital-acetaminophen-caffeine [facility]) [phone] MG per tablet Take 1 tablet by mouth every 6 (six) hours as needed for headache. 11/22/14 11/22/15  Wanda Plump, MD  cyclobenzaprine (FLEXERIL) 10 MG tablet Take 1 tablet (10 mg total) by mouth 2 (two) times daily as needed for muscle spasms. 11/20/14   Tatyana Kirichenko, PA-C  HYDROcodone-acetaminophen (NORCO/VICODIN) 5-325 MG per tablet Take 1-2 tablets by mouth every 4 (four) hours as needed for moderate pain or severe pain. 11/20/14   Tatyana Kirichenko, PA-C  HYDROcodone-homatropine (HYCODAN) 5-1.5 MG/5ML syrup Take 5 mLs by mouth every 6 (six) hours as needed for cough. 10/05/15   Charm Rings, MD  metoCLOPramide (REGLAN) 10 MG tablet Take 1 tablet (10 mg total) by mouth every 8 (eight) hours as needed for nausea (headache). 07/07/15   Loren Racer, MD  naproxen (NAPROSYN) 500 MG tablet Take 1 tablet (500 mg total) by mouth 2 (two) times daily. 11/20/14   Jaynie Crumble, PA-C  naproxen (NAPROSYN) 500 MG tablet Take 1 tablet (500 mg total) by mouth 2 (two) times daily. 02/01/15   Robyn M Hess, PA-C  predniSONE (DELTASONE) 50 MG tablet Take 1 pill daily for 5 days. 10/05/15   Charm RingsErin J Airyn Ellzey, MD  QUEtiapine (SEROQUEL) 100 MG tablet Take 1 tablet (100 mg total) by mouth at bedtime. 08/11/15 08/10/16  Oletta DarterSalina Agarwal, MD  valACYclovir (VALTREX) 500 MG tablet Take 1 tablet (500 mg total) by mouth 2 (two) times daily. For 3 days for each herpes flare up 12/06/14   Wanda PlumpJose E Paz, MD   Meds Ordered and Administered this Visit  Medications - No data to display  BP 119/80  mmHg  Pulse 88  Temp(Src) 98 F (36.7 C) (Oral)  SpO2 97% No data found.   Physical Exam  Constitutional: He appears well-developed and well-nourished. No distress.  HENT:  Mouth/Throat: No oropharyngeal exudate.  Clear nasal discharge present. Nasal mucosa is slightly edematous. Oropharynx with mild erythema. TMs normal bilaterally.  Neck: Neck supple.  Cardiovascular: Normal rate, regular rhythm and normal heart sounds.   No murmur heard. Pulmonary/Chest: Effort normal. No respiratory distress. He has no wheezes. He has no rales.  Diffusely coarse breath sounds. There is a slight wheeze with forced expiration.  Lymphadenopathy:    He has no cervical adenopathy.    ED Course  Procedures (including critical care time)  Labs Review Labs Reviewed - No data to display  Imaging Review No results found.    MDM   1. Bronchitis    Treatment prednisone, Hycodan, and albuterol. Discussed OTC allergy medicine for symptomatic relief. We also discussed the use of over-the-counter Afrin to help his nasal congestion. Emphasized that he can only use this for 3 days. Follow-up with PCP in 2-3 days if not improving.    Charm RingsErin J Armoni Depass, MD 10/05/15 [phone]

## 2015-10-05 NOTE — Discharge Instructions (Signed)
You have bronchitis. Take prednisone as prescribed. Use hycodan as needed for cough.  Do not drive while taking this medicine Use the albuterol every 4 hours as needed for wheezing or cough. You can use over the counter Afrin to help with nasal congestions for 3 DAYS ONLY. You should see improvement in the next 3-5 days. If you develop fevers, difficulty breathing, or are just not getting better, please come back or go to the emergency room.

## 2015-10-05 NOTE — ED Notes (Addendum)
Here with aggravated harsh cough, congestion with yellow phlegm and sore throat and now chest pressure and pain with cough Started 2 days ago unrelieved with taking otc Thera-Flu, mucinex Everyday smoker, not trying to quit

## 2015-12-25 ENCOUNTER — Encounter (HOSPITAL_COMMUNITY): Payer: Self-pay | Admitting: *Deleted

## 2015-12-25 ENCOUNTER — Emergency Department (HOSPITAL_COMMUNITY)
Admission: EM | Admit: 2015-12-25 | Discharge: 2015-12-25 | Disposition: A | Discharge status: Home / Self Care | Payer: Federal, State, Local not specified - PPO | Attending: Emergency Medicine | Admitting: Emergency Medicine

## 2015-12-25 DIAGNOSIS — F17210 Nicotine dependence, cigarettes, uncomplicated: Secondary | ICD-10-CM | POA: Insufficient documentation

## 2015-12-25 DIAGNOSIS — F419 Anxiety disorder, unspecified: Secondary | ICD-10-CM | POA: Diagnosis not present

## 2015-12-25 DIAGNOSIS — J029 Acute pharyngitis, unspecified: Secondary | ICD-10-CM | POA: Diagnosis not present

## 2015-12-25 DIAGNOSIS — R51 Headache: Secondary | ICD-10-CM | POA: Insufficient documentation

## 2015-12-25 DIAGNOSIS — Z791 Long term (current) use of non-steroidal anti-inflammatories (NSAID): Secondary | ICD-10-CM | POA: Insufficient documentation

## 2015-12-25 DIAGNOSIS — M791 Myalgia: Secondary | ICD-10-CM | POA: Diagnosis not present

## 2015-12-25 DIAGNOSIS — R079 Chest pain, unspecified: Secondary | ICD-10-CM | POA: Diagnosis not present

## 2015-12-25 DIAGNOSIS — R05 Cough: Secondary | ICD-10-CM | POA: Diagnosis present

## 2015-12-25 DIAGNOSIS — R6883 Chills (without fever): Secondary | ICD-10-CM

## 2015-12-25 DIAGNOSIS — F909 Attention-deficit hyperactivity disorder, unspecified type: Secondary | ICD-10-CM | POA: Diagnosis not present

## 2015-12-25 DIAGNOSIS — F319 Bipolar disorder, unspecified: Secondary | ICD-10-CM | POA: Diagnosis not present

## 2015-12-25 DIAGNOSIS — J45909 Unspecified asthma, uncomplicated: Secondary | ICD-10-CM | POA: Diagnosis not present

## 2015-12-25 DIAGNOSIS — R059 Cough, unspecified: Secondary | ICD-10-CM

## 2015-12-25 DIAGNOSIS — Z79899 Other long term (current) drug therapy: Secondary | ICD-10-CM | POA: Diagnosis not present

## 2015-12-25 MED ORDER — BENZONATATE 100 MG PO CAPS: 200.0000 mg | ORAL_CAPSULE | Freq: Two times a day (BID) | ORAL | Status: DC | PRN

## 2015-12-25 NOTE — ED Provider Notes (Signed)
CSN: [id number]     Arrival date & time 12/25/15  1109 History   First MD Initiated Contact with Patient 12/25/15 1215     Chief Complaint  Patient presents with  . URI     (Consider location/radiation/quality/duration/timing/severity/associated sxs/prior Treatment) HPI   Patient is a 20 year old male with no pertinent past medical history who presents the ED with URI symptoms. Patient reports having a dry cough, tension headache, chills, generalized body aches, sore throat and chest pain with coughing for the past 3 days. He notes he has been taking TheraFlu at home with no relief. Pt denies fever, neck stiffness, visual changes, photophobia, shortness of breath, hemoptysis, abdominal pain, N/V, urinary symptoms, numbness, tingling, weakness, seizures, syncope.  Patient denies any recent sick contacts.  Past Medical History  Diagnosis Date  . Anxiety and depression   . Bipolar 1 disorder (HCC)   . ADHD (attention deficit hyperactivity disorder)   . Asthma   . Migraine headache 11/22/2014  . Anxiety    Past Surgical History  Procedure Laterality Date  . Hernia repair      umbilical   Family History  Problem Relation Age of Onset  . Diabetes      gparents  . Cancer Neg Hx   . Alcohol abuse Neg Hx   . Drug abuse Neg Hx   . Hypertension Mother   . Hypertension Father   . Schizophrenia Paternal Uncle    Social History  Substance Use Topics  . Smoking status: Current Every Day Smoker -- 1.00 packs/day    Types: Cigarettes  . Smokeless tobacco: Never Used  . Alcohol Use: Yes     Comment: 3x/week up to 4 drinks at a time    Review of Systems  Constitutional: Positive for chills.  HENT: Positive for sore throat.   Respiratory: Positive for cough.   Cardiovascular: Positive for chest pain (with coughing).  Musculoskeletal: Positive for myalgias (generalized body aches).  Neurological: Positive for headaches.  All other systems reviewed and are negative.     Allergies   Review of patient's allergies indicates no known allergies.  Home Medications   Prior to Admission medications   Medication Sig Start Date End Date Taking? Authorizing Provider  acetaminophen (TYLENOL) 500 MG tablet Take 500 mg by mouth every 6 (six) hours as needed for headache.    Historical Provider, MD  acyclovir (ZOVIRAX) 800 MG tablet Take 0.5 tablets (400 mg total) by mouth 2 (two) times daily. 12/07/14   Teressa Lower, NP  albuterol (PROVENTIL HFA;VENTOLIN HFA) 108 (90 Base) MCG/ACT inhaler Inhale 2 puffs into the lungs every 4 (four) hours as needed for wheezing or shortness of breath (cough). 10/05/15   Charm Rings, MD  benzonatate (TESSALON) 100 MG capsule Take 2 capsules (200 mg total) by mouth 2 (two) times daily as needed for cough. 12/25/15   Barrett Henle, PA-C  buPROPion (WELLBUTRIN XL) 150 MG 24 hr tablet Take 1 tablet (150 mg total) by mouth daily. 08/11/15   Oletta Darter, MD  cyclobenzaprine (FLEXERIL) 10 MG tablet Take 1 tablet (10 mg total) by mouth 2 (two) times daily as needed for muscle spasms. 11/20/14   Tatyana Kirichenko, PA-C  HYDROcodone-acetaminophen (NORCO/VICODIN) 5-325 MG per tablet Take 1-2 tablets by mouth every 4 (four) hours as needed for moderate pain or severe pain. 11/20/14   Tatyana Kirichenko, PA-C  HYDROcodone-homatropine (HYCODAN) 5-1.5 MG/5ML syrup Take 5 mLs by mouth every 6 (six) hours as needed for cough. 10/05/15  Charm RingsErin J Honig, MD  metoCLOPramide (REGLAN) 10 MG tablet Take 1 tablet (10 mg total) by mouth every 8 (eight) hours as needed for nausea (headache). 07/07/15   Loren Raceravid Yelverton, MD  naproxen (NAPROSYN) 500 MG tablet Take 1 tablet (500 mg total) by mouth 2 (two) times daily. 11/20/14   Tatyana Kirichenko, PA-C  naproxen (NAPROSYN) 500 MG tablet Take 1 tablet (500 mg total) by mouth 2 (two) times daily. 02/01/15   Robyn M Hess, PA-C  predniSONE (DELTASONE) 50 MG tablet Take 1 pill daily for 5 days. 10/05/15   Charm RingsErin J Honig, MD   QUEtiapine (SEROQUEL) 100 MG tablet Take 1 tablet (100 mg total) by mouth at bedtime. 08/11/15 08/10/16  Oletta DarterSalina Agarwal, MD  valACYclovir (VALTREX) 500 MG tablet Take 1 tablet (500 mg total) by mouth 2 (two) times daily. For 3 days for each herpes flare up 12/06/14   Wanda PlumpJose E Paz, MD   BP 145/95 mmHg  Pulse 113  Temp(Src) 99.9 F (37.7 C) (Oral)  Resp 18  SpO2 99% Physical Exam  Constitutional: He is oriented to person, place, and time. He appears well-developed and well-nourished.  HENT:  Head: Normocephalic and atraumatic.  Right Ear: Tympanic membrane normal.  Left Ear: Tympanic membrane normal.  Nose: Nose normal. Right sinus exhibits no maxillary sinus tenderness and no frontal sinus tenderness. Left sinus exhibits no maxillary sinus tenderness and no frontal sinus tenderness.  Mouth/Throat: Uvula is midline and mucous membranes are normal. Posterior oropharyngeal erythema present. No oropharyngeal exudate, posterior oropharyngeal edema or tonsillar abscesses.  Eyes: Conjunctivae and EOM are normal. Pupils are equal, round, and reactive to light. Right eye exhibits no discharge. Left eye exhibits no discharge. No scleral icterus.  Neck: Normal range of motion. Neck supple.  Cardiovascular: Normal rate, regular rhythm, normal heart sounds and intact distal pulses.   HR 96  Pulmonary/Chest: Effort normal and breath sounds normal. No respiratory distress. He has no wheezes. He has no rales. He exhibits no tenderness.  Abdominal: Soft. Bowel sounds are normal. He exhibits no distension and no mass. There is no tenderness. There is no rebound and no guarding.  Musculoskeletal: Normal range of motion. He exhibits no edema.  Lymphadenopathy:    He has no cervical adenopathy.  Neurological: He is alert and oriented to person, place, and time.  Skin: Skin is warm and dry.  Nursing note and vitals reviewed.   ED Course  Procedures (including critical care time) Labs Review Labs Reviewed -  No data to display  Imaging Review No results found. I have personally reviewed and evaluated these images and lab results as part of my medical decision-making.   EKG Interpretation None      MDM   Final diagnoses:  Cough  Chills    Patients symptoms are consistent with URI, likely viral etiology. VSS. Lungs CTAB on exam, I do no feel that a CXR is warranted at this time. Discussed that antibiotics are not indicated for viral infections. Pt will be discharged with symptomatic treatment.  Verbalizes understanding and is agreeable with plan. Pt is hemodynamically stable & in NAD prior to dc.     Satira Sarkicole Elizabeth HubbellNadeau, [place] JerseyPA-C 12/25/15 1259  Donnetta HutchingBrian Cook, MD 12/25/15 [phone]

## 2015-12-25 NOTE — ED Notes (Addendum)
PT states he has cough, eye discomfort, chills, headaches, denies vomiting, not BM in 3 days but had one upon arrival

## 2015-12-25 NOTE — Discharge Instructions (Signed)
Take your medications as prescribed. I recommend taking Tylenol and/or Ibuprofen as prescribed over the counter as needed for fever and body aches/headache. Drink six 8 ounce glasses of water or gatorade daily to remain hydrated at home. You may also drink warm water or tea with honey to help with your sore throat and cough. Follow-up with your primary care provider in 3-4 days. Please return to the Emergency Department if symptoms worsen or new onset of uncontrollable fever, neck stiffness, visual changes, lightsensitivity, shortness of breath, coughing up blood, abdominal pain, vomiting, urinary symptoms, numbness, tingling, weakness, seizures, syncope.

## 2015-12-26 ENCOUNTER — Encounter (HOSPITAL_COMMUNITY): Payer: Self-pay

## 2015-12-26 ENCOUNTER — Emergency Department (HOSPITAL_COMMUNITY)
Admission: EM | Admit: 2015-12-26 | Discharge: 2015-12-26 | Disposition: A | Discharge status: Home / Self Care | Payer: Federal, State, Local not specified - PPO | Attending: Emergency Medicine | Admitting: Emergency Medicine

## 2015-12-26 DIAGNOSIS — J45909 Unspecified asthma, uncomplicated: Secondary | ICD-10-CM | POA: Diagnosis not present

## 2015-12-26 DIAGNOSIS — G43909 Migraine, unspecified, not intractable, without status migrainosus: Secondary | ICD-10-CM | POA: Insufficient documentation

## 2015-12-26 DIAGNOSIS — F17210 Nicotine dependence, cigarettes, uncomplicated: Secondary | ICD-10-CM | POA: Insufficient documentation

## 2015-12-26 DIAGNOSIS — Z791 Long term (current) use of non-steroidal anti-inflammatories (NSAID): Secondary | ICD-10-CM | POA: Insufficient documentation

## 2015-12-26 DIAGNOSIS — J40 Bronchitis, not specified as acute or chronic: Secondary | ICD-10-CM

## 2015-12-26 DIAGNOSIS — F419 Anxiety disorder, unspecified: Secondary | ICD-10-CM | POA: Diagnosis not present

## 2015-12-26 DIAGNOSIS — Z79899 Other long term (current) drug therapy: Secondary | ICD-10-CM | POA: Insufficient documentation

## 2015-12-26 DIAGNOSIS — F319 Bipolar disorder, unspecified: Secondary | ICD-10-CM | POA: Diagnosis not present

## 2015-12-26 DIAGNOSIS — R05 Cough: Secondary | ICD-10-CM | POA: Diagnosis present

## 2015-12-26 DIAGNOSIS — Z7952 Long term (current) use of systemic steroids: Secondary | ICD-10-CM | POA: Insufficient documentation

## 2015-12-26 MED ORDER — AZITHROMYCIN 250 MG PO TABS: 250.0000 mg | ORAL_TABLET | Freq: Every day | ORAL | Status: DC

## 2015-12-26 MED ORDER — ALBUTEROL SULFATE HFA 108 (90 BASE) MCG/ACT IN AERS: 2.0000 | INHALATION_SPRAY | RESPIRATORY_TRACT | Status: DC | PRN

## 2015-12-26 NOTE — ED Notes (Signed)
Pt c/o worsening cough, eye discomfort, and chills x 4 days.  Pt was seen yesterday for same and diagnosed w/ URI.  Pt reports "I feel worse and now, I'm coughing up yellow mucus."

## 2015-12-26 NOTE — ED Notes (Signed)
MD at bedside. 

## 2015-12-26 NOTE — ED Provider Notes (Signed)
CSN: [id number]     Arrival date & time 12/26/15  1029 History   First MD Initiated Contact with Patient 12/26/15 1046     Chief Complaint  Patient presents with  . Cough  . Chills    HPI Pt seen yesterday for similar symptoms. Reports cough, congestion, fever and chills x 4 days. Cough is dry. Reports myalgias and poor sleep because of this. Does have a hx of asthma. Reports his chills and poor sleep is more concerning to him. Now with productive cough. Symptoms are moderate in severity    Past Medical History  Diagnosis Date  . Anxiety and depression   . Bipolar 1 disorder (HCC)   . ADHD (attention deficit hyperactivity disorder)   . Asthma   . Migraine headache 11/22/2014  . Anxiety    Past Surgical History  Procedure Laterality Date  . Hernia repair      umbilical   Family History  Problem Relation Age of Onset  . Diabetes      gparents  . Cancer Neg Hx   . Alcohol abuse Neg Hx   . Drug abuse Neg Hx   . Hypertension Mother   . Hypertension Father   . Schizophrenia Paternal Uncle    Social History  Substance Use Topics  . Smoking status: Current Every Day Smoker -- 1.00 packs/day    Types: Cigarettes  . Smokeless tobacco: Never Used  . Alcohol Use: Yes     Comment: 3x/week up to 4 drinks at a time    Review of Systems  All other systems reviewed and are negative.     Allergies  Review of patient's allergies indicates no known allergies.  Home Medications   Prior to Admission medications   Medication Sig Start Date End Date Taking? Authorizing Provider  acetaminophen (TYLENOL) 500 MG tablet Take 500 mg by mouth every 6 (six) hours as needed for headache.    Historical Provider, MD  acyclovir (ZOVIRAX) 800 MG tablet Take 0.5 tablets (400 mg total) by mouth 2 (two) times daily. 12/07/14   Teressa LowerVrinda Pickering, NP  albuterol (PROVENTIL HFA;VENTOLIN HFA) 108 (90 Base) MCG/ACT inhaler Inhale 2 puffs into the lungs every 4 (four) hours as needed for wheezing or  shortness of breath. 12/26/15   Azalia BilisKevin Lavanda Nevels, MD  azithromycin (ZITHROMAX Z-PAK) 250 MG tablet Take 1 tablet (250 mg total) by mouth daily. Take 2 tabs for first dose, then 1 tab for each additional dose 12/26/15   Azalia BilisKevin Leean Amezcua, MD  buPROPion (WELLBUTRIN XL) 150 MG 24 hr tablet Take 1 tablet (150 mg total) by mouth daily. 08/11/15   Oletta DarterSalina Agarwal, MD  cyclobenzaprine (FLEXERIL) 10 MG tablet Take 1 tablet (10 mg total) by mouth 2 (two) times daily as needed for muscle spasms. 11/20/14   Tatyana Kirichenko, PA-C  HYDROcodone-acetaminophen (NORCO/VICODIN) 5-325 MG per tablet Take 1-2 tablets by mouth every 4 (four) hours as needed for moderate pain or severe pain. 11/20/14   Tatyana Kirichenko, PA-C  HYDROcodone-homatropine (HYCODAN) 5-1.5 MG/5ML syrup Take 5 mLs by mouth every 6 (six) hours as needed for cough. 10/05/15   Charm RingsErin J Honig, MD  metoCLOPramide (REGLAN) 10 MG tablet Take 1 tablet (10 mg total) by mouth every 8 (eight) hours as needed for nausea (headache). 07/07/15   Loren Raceravid Yelverton, MD  naproxen (NAPROSYN) 500 MG tablet Take 1 tablet (500 mg total) by mouth 2 (two) times daily. 11/20/14   Tatyana Kirichenko, PA-C  naproxen (NAPROSYN) 500 MG tablet Take 1 tablet (500  mg total) by mouth 2 (two) times daily. 02/01/15   Robyn M Hess, PA-C  predniSONE (DELTASONE) 50 MG tablet Take 1 pill daily for 5 days. 10/05/15   Charm Rings, MD  QUEtiapine (SEROQUEL) 100 MG tablet Take 1 tablet (100 mg total) by mouth at bedtime. 08/11/15 08/10/16  Oletta Darter, MD  valACYclovir (VALTREX) 500 MG tablet Take 1 tablet (500 mg total) by mouth 2 (two) times daily. For 3 days for each herpes flare up 12/06/14   Wanda Plump, MD   BP 135/84 mmHg  Pulse 107  Temp(Src) 99.7 F (37.6 C) (Oral)  Resp 16  SpO2 98% Physical Exam  Constitutional: He is oriented to person, place, and time. He appears well-developed and well-nourished.  HENT:  Head: Normocephalic and atraumatic.  Eyes: EOM are normal.  Neck: Normal range of  motion.  Cardiovascular: Normal rate, regular rhythm and normal heart sounds.   Pulmonary/Chest: Effort normal and breath sounds normal. No respiratory distress. He has no wheezes.  Abdominal: Soft. He exhibits no distension. There is no tenderness.  Musculoskeletal: Normal range of motion.  Neurological: He is alert and oriented to person, place, and time.  Skin: Skin is warm and dry.  Psychiatric: He has a normal mood and affect. Judgment normal.  Nursing note and vitals reviewed.   ED Course  Procedures (including critical care time) Labs Review Labs Reviewed - No data to display  Imaging Review No results found. I have personally reviewed and evaluated these images and lab results as part of my medical decision-making.   EKG Interpretation None      MDM   Final diagnoses:  Bronchitis     Bronchitis. Home with albuterol and azithromycin. Ibuprofen and tylenol for fever. Well appearing. pcp follow up  Azalia Bilis, MD 12/26/15 1053

## 2015-12-26 NOTE — Discharge Instructions (Signed)

## 2016-06-29 ENCOUNTER — Ambulatory Visit (HOSPITAL_COMMUNITY)
Admission: EM | Admit: 2016-06-29 | Discharge: 2016-06-29 | Disposition: A | Discharge status: Home / Self Care | Payer: Federal, State, Local not specified - PPO | Attending: Family Medicine | Admitting: Family Medicine

## 2016-06-29 ENCOUNTER — Encounter (HOSPITAL_COMMUNITY): Payer: Self-pay | Admitting: Emergency Medicine

## 2016-06-29 DIAGNOSIS — R197 Diarrhea, unspecified: Secondary | ICD-10-CM

## 2016-06-29 DIAGNOSIS — R112 Nausea with vomiting, unspecified: Secondary | ICD-10-CM

## 2016-06-29 MED ORDER — ONDANSETRON 4 MG PO TBDP
4.0000 mg | ORAL_TABLET | Freq: Once | ORAL | Status: AC
  Administered 2016-06-29: 4 mg via ORAL

## 2016-06-29 MED ORDER — ONDANSETRON 4 MG PO TBDP
ORAL_TABLET | ORAL | Status: AC
  Filled 2016-06-29: qty 1

## 2016-06-29 MED ORDER — ONDANSETRON 8 MG PO TBDP: 8.0000 mg | ORAL_TABLET | Freq: Three times a day (TID) | ORAL | 0 refills | Status: DC | PRN

## 2016-06-29 NOTE — Discharge Instructions (Signed)
Clear liquids today. Just to rest and stay quiet and you should be better tomorrow. If symptoms are persisting, please return or see her primary care doctor
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
## 2016-06-29 NOTE — ED Triage Notes (Signed)
Pt is here for abd pain onset today associated w/emesis, diarrhea  A&O x4... NAD

## 2016-08-06 ENCOUNTER — Encounter: Payer: Self-pay | Admitting: Medical

## 2016-08-06 ENCOUNTER — Ambulatory Visit (INDEPENDENT_AMBULATORY_CARE_PROVIDER_SITE_OTHER): Payer: Federal, State, Local not specified - PPO | Admitting: Medical

## 2016-08-06 ENCOUNTER — Other Ambulatory Visit (HOSPITAL_COMMUNITY)
Admission: RE | Admit: 2016-08-06 | Discharge: 2016-08-06 | Disposition: A | Discharge status: Home / Self Care | Payer: Federal, State, Local not specified - PPO | Source: Ambulatory Visit | Attending: Medical | Admitting: Medical

## 2016-08-06 VITALS — BP 130/85 | HR 89 | Wt 278.0 lb

## 2016-08-06 DIAGNOSIS — Z113 Encounter for screening for infections with a predominantly sexual mode of transmission: Secondary | ICD-10-CM

## 2016-08-06 NOTE — Progress Notes (Signed)
Pre visit review using our clinic review tool, if applicable. No additional management support is needed unless otherwise documented below in the visit note./hsm  
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
## 2016-08-06 NOTE — Patient Instructions (Addendum)
For std screening will get hiv, rpr, trichomonas, gonorrhea and chlamydia testing.   I would recommend you stop smoking.   Your bp is little hight today but on second check was better. Continue to check bp  in future. Notify us if bp over 140/90.  Follow up as needed post lab review.

## 2016-08-07 LAB — RPR

## 2016-08-07 LAB — URINE CYTOLOGY ANCILLARY ONLY
CHLAMYDIA, DNA PROBE: NEGATIVE
Neisseria Gonorrhea: NEGATIVE
Trichomonas: NEGATIVE

## 2016-08-07 LAB — HIV ANTIBODY (ROUTINE TESTING W REFLEX): HIV 1&2 Ab, 4th Generation: NONREACTIVE

## 2016-08-10 NOTE — Progress Notes (Signed)
Pt has seen results on MyChart and message also sent for patient to call back if any questions.

## 2017-06-16 ENCOUNTER — Emergency Department (HOSPITAL_COMMUNITY): Payer: No Typology Code available for payment source

## 2017-06-16 ENCOUNTER — Emergency Department (HOSPITAL_COMMUNITY)
Admission: EM | Admit: 2017-06-16 | Discharge: 2017-06-16 | Disposition: A | Discharge status: Home / Self Care | Payer: No Typology Code available for payment source | Attending: Emergency Medicine | Admitting: Emergency Medicine

## 2017-06-16 ENCOUNTER — Encounter (HOSPITAL_COMMUNITY): Payer: Self-pay | Admitting: Emergency Medicine

## 2017-06-16 DIAGNOSIS — S060X0A Concussion without loss of consciousness, initial encounter: Secondary | ICD-10-CM

## 2017-06-16 DIAGNOSIS — Y939 Activity, unspecified: Secondary | ICD-10-CM | POA: Insufficient documentation

## 2017-06-16 DIAGNOSIS — R51 Headache: Secondary | ICD-10-CM | POA: Diagnosis not present

## 2017-06-16 DIAGNOSIS — M79642 Pain in left hand: Secondary | ICD-10-CM | POA: Diagnosis not present

## 2017-06-16 DIAGNOSIS — Y92410 Unspecified street and highway as the place of occurrence of the external cause: Secondary | ICD-10-CM | POA: Diagnosis not present

## 2017-06-16 DIAGNOSIS — F17210 Nicotine dependence, cigarettes, uncomplicated: Secondary | ICD-10-CM | POA: Insufficient documentation

## 2017-06-16 DIAGNOSIS — Z791 Long term (current) use of non-steroidal anti-inflammatories (NSAID): Secondary | ICD-10-CM | POA: Insufficient documentation

## 2017-06-16 DIAGNOSIS — J45909 Unspecified asthma, uncomplicated: Secondary | ICD-10-CM | POA: Insufficient documentation

## 2017-06-16 DIAGNOSIS — Y998 Other external cause status: Secondary | ICD-10-CM | POA: Diagnosis not present

## 2017-06-16 MED ORDER — ONDANSETRON 4 MG PO TBDP
8.0000 mg | ORAL_TABLET | Freq: Once | ORAL | Status: AC
  Administered 2017-06-16: 8 mg via ORAL
  Filled 2017-06-16: qty 2

## 2017-06-16 MED ORDER — METOCLOPRAMIDE HCL 10 MG PO TABS: 10.0000 mg | ORAL_TABLET | Freq: Four times a day (QID) | ORAL | 0 refills | Status: DC | PRN

## 2017-06-16 MED ORDER — KETOROLAC TROMETHAMINE 60 MG/2ML IM SOLN
60.0000 mg | Freq: Once | INTRAMUSCULAR | Status: AC
  Administered 2017-06-16: 60 mg via INTRAMUSCULAR
  Filled 2017-06-16: qty 2

## 2017-06-16 MED ORDER — BUTALBITAL-APAP-CAFFEINE 50-325-40 MG PO TABS: 1.0000 | ORAL_TABLET | Freq: Three times a day (TID) | ORAL | 0 refills | Status: AC | PRN

## 2017-06-16 NOTE — ED Notes (Signed)
PA-C at bedside 
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
## 2017-06-16 NOTE — Discharge Instructions (Signed)
Do not drive a motor vehicle or operate heavy machinery for 1 week. We advise frequent resting and that you avoid activities that would overstimulate your brain such as frequent use of a computer screen, watching television, using a cell phone, or playing video games. We advise the use of Fioricet for headache. You may supplement this with 600 mg ibuprofen every 6 hours as needed. Take Reglan for nausea. Follow up with a primary care doctor by Friday. You may require follow up with a neurologist if symptoms persist. You may return for new or concerning symptoms.

## 2017-06-16 NOTE — ED Notes (Signed)
Patient left at this time with all belongings. 
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
## 2017-09-27 ENCOUNTER — Encounter (HOSPITAL_COMMUNITY): Payer: Self-pay | Admitting: Emergency Medicine

## 2017-09-27 ENCOUNTER — Other Ambulatory Visit: Payer: Self-pay

## 2017-09-27 ENCOUNTER — Emergency Department (HOSPITAL_COMMUNITY)
Admission: EM | Admit: 2017-09-27 | Discharge: 2017-09-27 | Disposition: A | Discharge status: Home / Self Care | Payer: Federal, State, Local not specified - PPO | Attending: Physician Assistant | Admitting: Physician Assistant

## 2017-09-27 DIAGNOSIS — J45909 Unspecified asthma, uncomplicated: Secondary | ICD-10-CM | POA: Diagnosis not present

## 2017-09-27 DIAGNOSIS — J069 Acute upper respiratory infection, unspecified: Secondary | ICD-10-CM

## 2017-09-27 DIAGNOSIS — F17210 Nicotine dependence, cigarettes, uncomplicated: Secondary | ICD-10-CM | POA: Insufficient documentation

## 2017-09-27 DIAGNOSIS — J029 Acute pharyngitis, unspecified: Secondary | ICD-10-CM | POA: Diagnosis present

## 2017-09-27 LAB — RAPID STREP SCREEN (MED CTR MEBANE ONLY): Streptococcus, Group A Screen (Direct): NEGATIVE

## 2017-09-27 MED ORDER — IBUPROFEN 800 MG PO TABS
800.0000 mg | ORAL_TABLET | Freq: Once | ORAL | Status: AC
  Administered 2017-09-27: 800 mg via ORAL
  Filled 2017-09-27: qty 1

## 2017-09-27 NOTE — Discharge Instructions (Signed)
Use ibuprofen or Tylenol to help with your symptoms.

## 2017-09-27 NOTE — ED Triage Notes (Signed)
Pt complaint of congestion, cough, and sore throat; new onset vomiting with cough causing worsening throat pain.

## 2017-09-27 NOTE — ED Notes (Signed)
Bed: WTR8 Expected date:  Expected time:  Means of arrival:  Comments: 
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
## 2017-09-29 LAB — CULTURE, GROUP A STREP (THRC)

## 2017-10-23 ENCOUNTER — Emergency Department (HOSPITAL_COMMUNITY)
Admission: EM | Admit: 2017-10-23 | Discharge: 2017-10-23 | Disposition: A | Discharge status: Home / Self Care | Payer: Federal, State, Local not specified - PPO | Attending: Emergency Medicine | Admitting: Emergency Medicine

## 2017-10-23 ENCOUNTER — Encounter (HOSPITAL_COMMUNITY): Payer: Self-pay | Admitting: Emergency Medicine

## 2017-10-23 DIAGNOSIS — F17210 Nicotine dependence, cigarettes, uncomplicated: Secondary | ICD-10-CM | POA: Insufficient documentation

## 2017-10-23 DIAGNOSIS — M542 Cervicalgia: Secondary | ICD-10-CM | POA: Diagnosis present

## 2017-10-23 DIAGNOSIS — M62838 Other muscle spasm: Secondary | ICD-10-CM | POA: Insufficient documentation

## 2017-10-23 MED ORDER — SUCRALFATE 1 G PO TABS: 1.0000 g | ORAL_TABLET | Freq: Three times a day (TID) | ORAL | 0 refills | Status: DC

## 2017-10-23 MED ORDER — KETOROLAC TROMETHAMINE 30 MG/ML IJ SOLN
30.0000 mg | Freq: Once | INTRAMUSCULAR | Status: AC
  Administered 2017-10-23: 30 mg via INTRAMUSCULAR
  Filled 2017-10-23: qty 1

## 2017-10-23 MED ORDER — MELOXICAM 7.5 MG PO TABS: 7.5000 mg | ORAL_TABLET | Freq: Every day | ORAL | 0 refills | Status: DC

## 2017-10-23 MED ORDER — DIAZEPAM 2 MG PO TABS
2.0000 mg | ORAL_TABLET | Freq: Once | ORAL | Status: AC
  Administered 2017-10-23: 2 mg via ORAL
  Filled 2017-10-23: qty 1

## 2017-10-23 MED ORDER — METHOCARBAMOL 750 MG PO TABS: 750.0000 mg | ORAL_TABLET | Freq: Three times a day (TID) | ORAL | 0 refills | Status: DC | PRN

## 2017-10-23 MED ORDER — PANTOPRAZOLE SODIUM 20 MG PO TBEC: 20.0000 mg | DELAYED_RELEASE_TABLET | Freq: Every day | ORAL | 0 refills | Status: DC

## 2017-10-23 NOTE — ED Triage Notes (Signed)
Patient c/o right neck and right upper back pain for about week patient reports that pain is worse with movement. Pain now radiating to right ear and jaw. Patient c/o headache now due to pain.

## 2017-10-23 NOTE — ED Notes (Signed)
Spoke with patient about his high blood pressure readings. He verbalizes understanding to follow up with his PCP. In the meantime, he will watch it, try to eat less salt, and exercise.

## 2017-10-23 NOTE — Discharge Instructions (Signed)
You may not take ibuprofen, Advil, Motrin, naproxen, Aleve, aspirin, or other NSAID medications while taking meloxicam.  You may take Tylenol while taking meloxicam.  Please monitor your bowel movements were dark, tarry, sticky stools as this can indicate an ulcer that is bleeding.  If you develop the symptoms then stop taking Mobic or other NSAIDs and call your primary care doctor.   Do not start meloxicam until tomorrow morning as you had a torodol shot in the Emergency room.   Please take Tylenol (acetaminophen) to relieve your pain.  You may take tylenol, up to 1,000 mg (two extra strength pills).  Do not take more than 3,000 mg tylenol in a 24 hour period.  Please check all medication labels as many medications such as pain and cold medications may contain tylenol. Please do not drink alcohol while taking this medication.   Please make sure you are staying well hydrated.    The best way to get rid of muscle pain is by taking NSAIDS, using heat, massage therapy, and gentle stretching/range of motion exercises.  While in the ED your blood pressure was high.  Please follow up with your primary care doctor or the wellness clinic for repeat evaluation as you may need medication.  High blood pressure can cause long term, potentially serious, damage if left untreated.   The Carafate is to help coat the lining of your stomach to help with nausea.  The Protonix is available over-the-counter, and may also help with nausea.    Today you received medications that may make you sleepy or impair your ability to make decisions.  For the next 24 hours please do not drive, operate heavy machinery, care for a small child with out another adult present, or perform any activities that may cause harm to you or someone else if you were to fall asleep or be impaired.   You are being prescribed a medication which may make you sleepy. Please follow up of listed precautions for at least 24 hours after taking one  dose.
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
## 2020-02-26 ENCOUNTER — Other Ambulatory Visit: Payer: Self-pay

## 2020-02-26 ENCOUNTER — Emergency Department (HOSPITAL_BASED_OUTPATIENT_CLINIC_OR_DEPARTMENT_OTHER)
Admission: EM | Admit: 2020-02-26 | Discharge: 2020-02-26 | Disposition: A | Discharge status: Home / Self Care | Payer: Self-pay | Attending: Emergency Medicine | Admitting: Emergency Medicine

## 2020-02-26 ENCOUNTER — Encounter (HOSPITAL_BASED_OUTPATIENT_CLINIC_OR_DEPARTMENT_OTHER): Payer: Self-pay

## 2020-02-26 ENCOUNTER — Emergency Department (HOSPITAL_BASED_OUTPATIENT_CLINIC_OR_DEPARTMENT_OTHER): Payer: Self-pay

## 2020-02-26 DIAGNOSIS — Z202 Contact with and (suspected) exposure to infections with a predominantly sexual mode of transmission: Secondary | ICD-10-CM | POA: Insufficient documentation

## 2020-02-26 DIAGNOSIS — Z20822 Contact with and (suspected) exposure to covid-19: Secondary | ICD-10-CM | POA: Insufficient documentation

## 2020-02-26 DIAGNOSIS — J40 Bronchitis, not specified as acute or chronic: Secondary | ICD-10-CM | POA: Insufficient documentation

## 2020-02-26 DIAGNOSIS — F17210 Nicotine dependence, cigarettes, uncomplicated: Secondary | ICD-10-CM | POA: Insufficient documentation

## 2020-02-26 LAB — URINALYSIS, ROUTINE W REFLEX MICROSCOPIC
Bilirubin Urine: NEGATIVE
Glucose, UA: NEGATIVE mg/dL
Hgb urine dipstick: NEGATIVE
Ketones, ur: NEGATIVE mg/dL
Leukocytes,Ua: NEGATIVE
Nitrite: NEGATIVE
Protein, ur: NEGATIVE mg/dL
Specific Gravity, Urine: >1.03 — ABNORMAL HIGH (ref 1.005–1.030)
pH: 6 (ref 5.0–8.0)

## 2020-02-26 LAB — HIV ANTIBODY (ROUTINE TESTING W REFLEX): HIV Screen 4th Generation wRfx: NONREACTIVE

## 2020-02-26 LAB — SARS CORONAVIRUS 2 BY RT PCR (HOSPITAL ORDER, PERFORMED IN ~~LOC~~ HOSPITAL LAB): SARS Coronavirus 2: NEGATIVE

## 2020-02-26 MED ORDER — ALBUTEROL SULFATE HFA 108 (90 BASE) MCG/ACT IN AERS
2.0000 | INHALATION_SPRAY | RESPIRATORY_TRACT | Status: DC | PRN
  Administered 2020-02-26: 2 via RESPIRATORY_TRACT
  Filled 2020-02-26: qty 6.7

## 2020-02-26 MED ORDER — AZITHROMYCIN 250 MG PO TABS
1000.0000 mg | ORAL_TABLET | Freq: Once | ORAL | Status: AC
  Administered 2020-02-26: 1000 mg via ORAL
  Filled 2020-02-26: qty 4

## 2020-02-26 MED ORDER — CEFTRIAXONE SODIUM 500 MG IJ SOLR
500.0000 mg | Freq: Once | INTRAMUSCULAR | Status: AC
  Administered 2020-02-26: 500 mg via INTRAMUSCULAR
  Filled 2020-02-26: qty 500

## 2020-02-26 MED ORDER — PREDNISONE 20 MG PO TABS: 40.0000 mg | ORAL_TABLET | Freq: Every day | ORAL | 0 refills | Status: AC

## 2020-02-26 MED ORDER — LIDOCAINE HCL (PF) 1 % IJ SOLN
INTRAMUSCULAR | Status: AC
  Administered 2020-02-26: 1 mL
  Filled 2020-02-26: qty 5

## 2020-02-26 NOTE — ED Triage Notes (Signed)
C/o flu like sx x 2 days-pt state he also had "STI exposure"-reports "lesions" to penis x 2 weeks-denies dysuira and penile d/c-NAD-steady gait
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
## 2020-02-26 NOTE — Discharge Instructions (Signed)
You have been treated for gonorrhea and chlamydia.  Follow-up with the health department pending the results of the STD test.  The lesions on the penis also need to be followed.  Your Covid test is also pending.  Isolate yourself until you have a negative result.

## 2020-02-26 NOTE — ED Notes (Signed)
ED Provider at bedside. 

## 2020-02-27 LAB — RPR: RPR Ser Ql: NONREACTIVE

## 2020-02-29 LAB — GC/CHLAMYDIA PROBE AMP (~~LOC~~) NOT AT ARMC
Chlamydia: NEGATIVE
Comment: NEGATIVE
Comment: NORMAL
Neisseria Gonorrhea: NEGATIVE

## 2021-07-06 ENCOUNTER — Emergency Department (HOSPITAL_BASED_OUTPATIENT_CLINIC_OR_DEPARTMENT_OTHER)
Admission: EM | Admit: 2021-07-06 | Discharge: 2021-07-06 | Disposition: A | Discharge status: Home / Self Care | Payer: Self-pay | Attending: Emergency Medicine | Admitting: Emergency Medicine

## 2021-07-06 ENCOUNTER — Encounter (HOSPITAL_BASED_OUTPATIENT_CLINIC_OR_DEPARTMENT_OTHER): Payer: Self-pay | Admitting: *Deleted

## 2021-07-06 ENCOUNTER — Other Ambulatory Visit: Payer: Self-pay

## 2021-07-06 DIAGNOSIS — Z202 Contact with and (suspected) exposure to infections with a predominantly sexual mode of transmission: Secondary | ICD-10-CM | POA: Insufficient documentation

## 2021-07-06 DIAGNOSIS — F17210 Nicotine dependence, cigarettes, uncomplicated: Secondary | ICD-10-CM | POA: Insufficient documentation

## 2021-07-06 DIAGNOSIS — J45909 Unspecified asthma, uncomplicated: Secondary | ICD-10-CM | POA: Insufficient documentation

## 2021-07-06 LAB — URINALYSIS, ROUTINE W REFLEX MICROSCOPIC
Bilirubin Urine: NEGATIVE
Glucose, UA: NEGATIVE mg/dL
Hgb urine dipstick: NEGATIVE
Ketones, ur: 5 mg/dL — AB
Leukocytes,Ua: NEGATIVE
Nitrite: NEGATIVE
Protein, ur: NEGATIVE mg/dL
Specific Gravity, Urine: 1.025 (ref 1.005–1.030)
pH: 5.5 (ref 5.0–8.0)
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
## 2021-07-06 NOTE — ED Triage Notes (Signed)
STD check. States he was informed he was exposed to syphilis and chlamydia.

## 2021-07-07 LAB — RPR: RPR Ser Ql: NONREACTIVE

## 2021-07-07 LAB — HIV ANTIBODY (ROUTINE TESTING W REFLEX): HIV Screen 4th Generation wRfx: NONREACTIVE

## 2021-07-07 LAB — GC/CHLAMYDIA PROBE AMP (~~LOC~~) NOT AT ARMC
Chlamydia: NEGATIVE
Comment: NEGATIVE
Comment: NORMAL
Neisseria Gonorrhea: NEGATIVE

## 2022-10-31 ENCOUNTER — Encounter (HOSPITAL_BASED_OUTPATIENT_CLINIC_OR_DEPARTMENT_OTHER): Payer: Self-pay

## 2022-10-31 ENCOUNTER — Emergency Department (HOSPITAL_BASED_OUTPATIENT_CLINIC_OR_DEPARTMENT_OTHER)
Admission: EM | Admit: 2022-10-31 | Discharge: 2022-10-31 | Disposition: A | Discharge status: Home / Self Care | Payer: BLUE CROSS/BLUE SHIELD | Attending: Emergency Medicine | Admitting: Emergency Medicine

## 2022-10-31 DIAGNOSIS — Z202 Contact with and (suspected) exposure to infections with a predominantly sexual mode of transmission: Secondary | ICD-10-CM | POA: Diagnosis not present

## 2022-10-31 DIAGNOSIS — N4889 Other specified disorders of penis: Secondary | ICD-10-CM | POA: Diagnosis not present

## 2022-10-31 DIAGNOSIS — J45909 Unspecified asthma, uncomplicated: Secondary | ICD-10-CM | POA: Diagnosis not present

## 2022-10-31 LAB — URINALYSIS, ROUTINE W REFLEX MICROSCOPIC
Bilirubin Urine: NEGATIVE
Glucose, UA: NEGATIVE mg/dL
Hgb urine dipstick: NEGATIVE
Ketones, ur: NEGATIVE mg/dL
Leukocytes,Ua: NEGATIVE
Nitrite: NEGATIVE
Protein, ur: 100 mg/dL — AB
Specific Gravity, Urine: >=1.03 (ref 1.005–1.030)
pH: 5.5 (ref 5.0–8.0)

## 2022-10-31 LAB — HIV ANTIBODY (ROUTINE TESTING W REFLEX): HIV Screen 4th Generation wRfx: NONREACTIVE

## 2022-10-31 LAB — URINALYSIS, MICROSCOPIC (REFLEX)

## 2022-10-31 MED ORDER — CEFTRIAXONE SODIUM 500 MG IJ SOLR
500.0000 mg | Freq: Once | INTRAMUSCULAR | Status: AC
  Administered 2022-10-31: 500 mg via INTRAMUSCULAR
  Filled 2022-10-31: qty 500

## 2022-10-31 MED ORDER — DOXYCYCLINE HYCLATE 100 MG PO CAPS: 100.0000 mg | ORAL_CAPSULE | Freq: Two times a day (BID) | ORAL | 0 refills | Status: AC

## 2022-10-31 MED ORDER — DOXYCYCLINE HYCLATE 100 MG PO TABS
100.0000 mg | ORAL_TABLET | Freq: Once | ORAL | Status: AC
  Administered 2022-10-31: 100 mg via ORAL
  Filled 2022-10-31: qty 1

## 2022-10-31 MED ORDER — LIDOCAINE HCL (PF) 1 % IJ SOLN
1.0000 mL | Freq: Once | INTRAMUSCULAR | Status: AC
  Administered 2022-10-31: 2 mL
  Filled 2022-10-31: qty 5

## 2022-10-31 MED ORDER — VALACYCLOVIR HCL 1 G PO TABS: 1000.0000 mg | ORAL_TABLET | Freq: Three times a day (TID) | ORAL | 0 refills | Status: AC

## 2022-10-31 NOTE — ED Triage Notes (Signed)
Had bruise like coloration on penis, then had blisters that opened. Denies penile discharge or urinary symptoms. Multiple sexual partners.

## 2022-10-31 NOTE — ED Provider Notes (Signed)
[facility] Provider Note   CSN: [id number] Arrival date & time: 10/31/22  0913     History  Chief Complaint  Patient presents with   Penis Pain   HPI [name] is a 27 y.o. male with genital herpes, bipolar disorder, and asthma presenting for penile pain.  Pain started last week.  He also noticed like a bruise on his penis on Friday.  Bruising is located more on the tip of his penis and states it was painful.  The pain has since subsided but he says the appearance of it is most concerning.  Patient endorses multiple sex partners.  Denies fever.  Denies abnormal penile bleeding, discharge or painful urination.   Penis Pain       Home Medications Prior to Admission medications   Medication Sig Start Date End Date Taking? Authorizing Provider  doxycycline (VIBRAMYCIN) 100 MG capsule Take 1 capsule (100 mg total) by mouth 2 (two) times daily. 10/31/22  Yes Harriet Pho, PA-C  valACYclovir (VALTREX) 1000 MG tablet Take 1 tablet (1,000 mg total) by mouth 3 (three) times daily. 10/31/22  Yes Harriet Pho, PA-C  predniSONE (DELTASONE) 20 MG tablet Take 2 tablets (40 mg total) by mouth daily. 02/26/20   Davonna Belling, MD      Allergies    Pollen extract    Review of Systems   Review of Systems  Genitourinary:  Positive for penile pain.    Physical Exam Updated Vital Signs BP (!) 152/103 (BP Location: Left Arm)   Pulse 75   Temp 98.3 F (36.8 C) (Oral)   Resp 18   Ht 6' (1.829 m)   Wt 130.6 kg   SpO2 99%   BMI 39.06 kg/m  Physical Exam Constitutional:      Appearance: Normal appearance.  HENT:     Head: Normocephalic.     Nose: Nose normal.  Eyes:     Conjunctiva/sclera: Conjunctivae normal.  Pulmonary:     Effort: Pulmonary effort is normal.  Genitourinary:    Penis: Lesions present.      Testes: Normal.    Neurological:     Mental Status: He is alert.  Psychiatric:        Mood and  Affect: Mood normal.     ED Results / Procedures / Treatments   Labs (all labs ordered are listed, but only abnormal results are displayed) Labs Reviewed  URINALYSIS, ROUTINE W REFLEX MICROSCOPIC - Abnormal; Notable for the following components:      Result Value   Protein, ur 100 (*)    All other components within normal limits  URINALYSIS, MICROSCOPIC (REFLEX) - Abnormal; Notable for the following components:   Bacteria, UA RARE (*)    All other components within normal limits  RPR  HIV ANTIBODY (ROUTINE TESTING W REFLEX)  GC/CHLAMYDIA PROBE AMP ([place]) NOT AT [facility]    EKG None  Radiology No results found.  Procedures Procedures    Medications Ordered in ED Medications  cefTRIAXone (ROCEPHIN) injection 500 mg (has no administration in time range)  lidocaine (PF) (XYLOCAINE) 1 % injection 1-2.1 mL (has no administration in time range)  doxycycline (VIBRA-TABS) tablet 100 mg (has no administration in time range)    ED Course/ Medical Decision Making/ A&P                             Medical Decision Making Amount  and/or Complexity of Data Reviewed Labs: ordered.   27 year old male who is well-appearing and hemodynamically stable presenting for penile pain. Physical exam was notable for ulcerated like lesions about the tip of the penis.  Differential diagnosis for this complaint includes herpetic lesion, gonorrhea chlamydia, trichomonas, and UTI.  I reviewed and interpreted labs which revealed bacteriuria.  Given his recent history of multiple sexual partners and findings from UA felt it warranted empiric treatment of gonorrhea chlamydia and also sent STI exposure panel.  Advised to follow-up on MyChart for results and abstain from sex in the meantime.  Also advised to inform sexual partners if he is positive for an STI to seek out treatment at the health department.  Treated empirically for gonorrhea and chlamydia with ceftriaxone and doxycycline.  Sent doxycycline  to his pharmacy.  Also sent Valtrex to his pharmacy per request and to also cover for possible herpetic lesion.        Final Clinical Impression(s) / ED Diagnoses Final diagnoses:  STD exposure  Penile pain    Rx / DC Orders ED Discharge Orders          Ordered    doxycycline (VIBRAMYCIN) 100 MG capsule  2 times daily        10/31/22 1206    valACYclovir (VALTREX) 1000 MG tablet  3 times daily        10/31/22 1207              Harriet Pho, PA-C 10/31/22 1217    Horton, Alvin Critchley, DO 10/31/22 1548

## 2022-10-31 NOTE — Discharge Instructions (Signed)
Evaluation for your penile pain revealed that you could likely have a STI.  Will go ahead and treat you for gonorrhea and chlamydia with ceftriaxone which she will receive here before you leave and doxycycline.  Sent the doxycycline to your pharmacy.  I have also refilled your Valtrex.  I have sent off your STI exposure panel.  Please follow-up on results in MyChart.  In the meantime I would recommend abstinence from sex until results are posted.  If you are positive for an STI please advise your sexual partners that she seek out treatment at the health department.

## 2022-11-01 LAB — RPR: RPR Ser Ql: NONREACTIVE

## 2022-11-01 LAB — GC/CHLAMYDIA PROBE AMP (~~LOC~~) NOT AT ARMC
Chlamydia: NEGATIVE
Comment: NEGATIVE
Comment: NORMAL
Neisseria Gonorrhea: NEGATIVE
